# Patient Record
Sex: Male | Born: 1960
Health system: Southern US, Community
[De-identification: ages and names within clinical notes are randomized; demographics above are authoritative.]

## PROBLEM LIST (undated history)

## (undated) DIAGNOSIS — K7460 Unspecified cirrhosis of liver: Secondary | ICD-10-CM

## (undated) DIAGNOSIS — I739 Peripheral vascular disease, unspecified: Secondary | ICD-10-CM

## (undated) DIAGNOSIS — I714 Abdominal aortic aneurysm, without rupture, unspecified: Secondary | ICD-10-CM

## (undated) DIAGNOSIS — K469 Unspecified abdominal hernia without obstruction or gangrene: Secondary | ICD-10-CM

## (undated) DIAGNOSIS — R161 Splenomegaly, not elsewhere classified: Secondary | ICD-10-CM

## (undated) DIAGNOSIS — I219 Acute myocardial infarction, unspecified: Secondary | ICD-10-CM

## (undated) DIAGNOSIS — I2510 Atherosclerotic heart disease of native coronary artery without angina pectoris: Secondary | ICD-10-CM

## (undated) DIAGNOSIS — L0390 Cellulitis, unspecified: Secondary | ICD-10-CM

## (undated) DIAGNOSIS — B182 Chronic viral hepatitis C: Secondary | ICD-10-CM

## (undated) DIAGNOSIS — J449 Chronic obstructive pulmonary disease, unspecified: Secondary | ICD-10-CM

## (undated) HISTORY — PX: OTHER SURGICAL HISTORY: SHX169

## (undated) HISTORY — PX: CHOLECYSTECTOMY: SHX55

## (undated) HISTORY — PX: HERNIA REPAIR: SHX51

## (undated) HISTORY — PX: ABDOMINAL AORTIC ANEURYSM REPAIR W/ ENDOLUMINAL GRAFT: SUR7

---

## 2017-05-06 ENCOUNTER — Encounter (HOSPITAL_COMMUNITY): Payer: Self-pay | Admitting: Emergency Medicine

## 2017-05-06 ENCOUNTER — Emergency Department (HOSPITAL_COMMUNITY): Payer: Medicare Other

## 2017-05-06 ENCOUNTER — Emergency Department (HOSPITAL_COMMUNITY)
Admission: EM | Admit: 2017-05-06 | Discharge: 2017-05-06 | Disposition: A | Discharge status: Home / Self Care | Payer: Medicare Other | Attending: Emergency Medicine | Admitting: Emergency Medicine

## 2017-05-06 DIAGNOSIS — L03113 Cellulitis of right upper limb: Secondary | ICD-10-CM | POA: Diagnosis not present

## 2017-05-06 DIAGNOSIS — M79631 Pain in right forearm: Secondary | ICD-10-CM | POA: Diagnosis present

## 2017-05-06 DIAGNOSIS — F17200 Nicotine dependence, unspecified, uncomplicated: Secondary | ICD-10-CM | POA: Insufficient documentation

## 2017-05-06 HISTORY — DX: Cellulitis, unspecified: L03.90

## 2017-05-06 LAB — CBC
HCT: 39.3 % (ref 39.0–52.0)
HEMOGLOBIN: 13 g/dL (ref 13.0–17.0)
MCH: 27.7 pg (ref 26.0–34.0)
MCHC: 33.1 g/dL (ref 30.0–36.0)
MCV: 83.6 fL (ref 78.0–100.0)
Platelets: 119 10*3/uL — ABNORMAL LOW (ref 150–400)
RBC: 4.7 MIL/uL (ref 4.22–5.81)
RDW: 15.9 % — ABNORMAL HIGH (ref 11.5–15.5)
WBC: 5.7 10*3/uL (ref 4.0–10.5)

## 2017-05-06 MED ORDER — IBUPROFEN 400 MG PO TABS
400.0000 mg | ORAL_TABLET | Freq: Once | ORAL | Status: AC
  Administered 2017-05-06: 400 mg via ORAL
  Filled 2017-05-06: qty 1

## 2017-05-06 MED ORDER — CLINDAMYCIN HCL 150 MG PO CAPS
300.0000 mg | ORAL_CAPSULE | Freq: Once | ORAL | Status: AC
  Administered 2017-05-06: 300 mg via ORAL
  Filled 2017-05-06: qty 2

## 2017-05-06 MED ORDER — CLINDAMYCIN HCL 150 MG PO CAPS: 150.0000 mg | ORAL_CAPSULE | Freq: Four times a day (QID) | ORAL | 0 refills | Status: DC

## 2017-05-06 NOTE — ED Provider Notes (Signed)
[facility]-EMERGENCY DEPT Provider Note   CSN: [id number] Arrival date & time: 05/06/17  0946     History   Chief Complaint Chief Complaint  Patient presents with  . Abscess  . Arm Pain    HPI Edward Little is a 56 y.o. male.  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 Patient states the swelling started 3 days ago but redness Much worse today. He denies any drug use in the last 18 months. He denies fever but states he felt slightly chilled this morning. He has been taking ibuprofen for pain. The last time surgery had to be done to this arm was 1 year ago. He currently moved here from [facility] and has no doctors in this area. He denies any numbness or weakness to the right hand.   The history is provided by the patient.  Abscess  Location:  Shoulder/arm Shoulder/arm abscess location:  R forearm Abscess quality: painful, redness and warmth   Duration:  3 days Progression:  Worsening Pain details:    Quality:  Pressure and throbbing   Severity:  Moderate   Duration:  3 days   Timing:  Constant   Progression:  Worsening Chronicity:  Recurrent Context: injected drug use   Context: not diabetes   Context comment:  Last injected IVD 18 months ago Relieved by:  Nothing Worsened by:  Nothing Ineffective treatments:  NSAIDs Associated symptoms: no anorexia, no fever, no nausea and no vomiting   Arm Pain     Past Medical History:  Diagnosis Date  . Cellulitis     There are no active problems to display for this patient.   History reviewed. No pertinent surgical history.     Home Medications    Prior to Admission medications   Not on File    Family History No family history on file.  Social History Social History  Substance Use Topics  . Smoking  status: Current Every Day Smoker  . Smokeless tobacco: Current User  . Alcohol use No     Allergies   Patient has no allergy information on record.   Review of Systems Review of Systems  Constitutional: Negative for fever.  Gastrointestinal: Negative for anorexia, nausea and vomiting.  All other systems reviewed and are negative.    Physical Exam Updated Vital Signs BP (!) 167/76 (BP Location: Left Arm)   Pulse 99   Ht  (1.854 m)   Wt 90.7 kg (200 lb)   SpO2 99%   BMI 26.39 kg/m   Physical Exam  Constitutional: He is oriented to person, place, and time. He appears well-developed and well-nourished.  HENT:  Head: Normocephalic and atraumatic.  Cardiovascular: Normal rate and intact distal pulses.   Pulmonary/Chest: Effort normal.  Musculoskeletal: He exhibits tenderness.       Arms: Neurological: He is alert and oriented to person, place, and time.  Skin: Skin is warm.  Psychiatric: He has a normal mood and affect. His behavior is normal.  Nursing note and vitals reviewed.    ED Treatments / Results  Labs (all labs ordered are listed, but only abnormal results are displayed) Labs Reviewed  CBC - Abnormal; Notable for the following:       Result Value   RDW 15.9 (*)    Platelets 119 (*)  All other components within normal limits    EKG  EKG Interpretation None       Radiology Dg Forearm Right  Result Date: 05/06/2017 CLINICAL DATA:  Right forearm pain and swelling. EXAM: RIGHT FOREARM - 2 VIEW COMPARISON:  None. FINDINGS: There is no evidence of fracture or other focal bone lesions. Linear metallic density is seen in the soft tissues volar to the distal ulna concerning for foreign body. IMPRESSION: No fracture or dislocation is noted. Linear metallic foreign body is seen in soft tissues volar to distal ulnar shaft. Electronically Signed   By: Lupita Raider, M.D.   On: 05/06/2017 15:35    Procedures Procedures (including critical care  time)  Medications Ordered in ED Medications  clindamycin (CLEOCIN) capsule 300 mg (not administered)  ibuprofen (ADVIL,MOTRIN) tablet 400 mg (not administered)     Initial Impression / Assessment and Plan / ED Course  I have reviewed the triage vital signs and the nursing notes.  Pertinent labs & imaging results that were available during my care of the patient were reviewed by me and considered in my medical decision making (see chart for details).     Patient presenting with evidence of cellulitis to his right arm. Patient has had multiple surgical debridements of that arm in the past but today appears to just be cellulitis. There is no induration, fluctuance or localized point tenderness. He does have erythema, warmth and swelling. The swelling does extend down to the dorsal surface of the top of the hands. No palmar surface of the arm affected. Vascularly intact at this time. Normal handgrip in sensation on the right. X-ray without acute findings. Patient is afebrile here.  Will treat with clindamycin and close follow-up. Patient instructed to return here in 2 days if he is unable to see a provider for recheck.  Final Clinical Impressions(s) / ED Diagnoses   Final diagnoses:  Cellulitis of right upper extremity    New Prescriptions New Prescriptions   CLINDAMYCIN (CLEOCIN) 150 MG CAPSULE    Take 1 capsule (150 mg total) by mouth every 6 (six) hours.     Gwyneth Sprout, MD 05/06/17 [id number]

## 2017-05-06 NOTE — ED Triage Notes (Signed)
Pt. Stated, I've had redness and pain on rt. Forearm since Thursday. This morning it was worse.  Rt. forearm is red and swollen

## 2017-05-06 NOTE — ED Notes (Signed)
Declined W/C at D/C and was escorted to lobby by RN. 

## 2017-05-06 NOTE — ED Triage Notes (Signed)
Boarders of cellulitis on rt arm  Marked with skin marker.

## 2017-06-07 ENCOUNTER — Encounter (HOSPITAL_COMMUNITY): Payer: Self-pay | Admitting: *Deleted

## 2017-06-07 ENCOUNTER — Encounter (HOSPITAL_COMMUNITY): Payer: Self-pay | Admitting: Emergency Medicine

## 2017-06-07 ENCOUNTER — Emergency Department (HOSPITAL_COMMUNITY)
Admission: EM | Admit: 2017-06-07 | Discharge: 2017-06-07 | Disposition: A | Discharge status: Home / Self Care | Payer: Medicare Other | Source: Home / Self Care | Attending: Emergency Medicine | Admitting: Emergency Medicine

## 2017-06-07 ENCOUNTER — Emergency Department (HOSPITAL_COMMUNITY)
Admission: EM | Admit: 2017-06-07 | Discharge: 2017-06-07 | Disposition: A | Discharge status: Home / Self Care | Payer: Medicare Other | Attending: Emergency Medicine | Admitting: Emergency Medicine

## 2017-06-07 ENCOUNTER — Other Ambulatory Visit: Payer: Self-pay

## 2017-06-07 DIAGNOSIS — M79605 Pain in left leg: Principal | ICD-10-CM

## 2017-06-07 DIAGNOSIS — M79604 Pain in right leg: Principal | ICD-10-CM

## 2017-06-07 DIAGNOSIS — M79606 Pain in leg, unspecified: Secondary | ICD-10-CM | POA: Diagnosis present

## 2017-06-07 DIAGNOSIS — Z79899 Other long term (current) drug therapy: Secondary | ICD-10-CM | POA: Insufficient documentation

## 2017-06-07 DIAGNOSIS — G8929 Other chronic pain: Secondary | ICD-10-CM | POA: Insufficient documentation

## 2017-06-07 DIAGNOSIS — I739 Peripheral vascular disease, unspecified: Secondary | ICD-10-CM

## 2017-06-07 DIAGNOSIS — Z7982 Long term (current) use of aspirin: Secondary | ICD-10-CM | POA: Insufficient documentation

## 2017-06-07 DIAGNOSIS — F17200 Nicotine dependence, unspecified, uncomplicated: Secondary | ICD-10-CM | POA: Insufficient documentation

## 2017-06-07 DIAGNOSIS — F17228 Nicotine dependence, chewing tobacco, with other nicotine-induced disorders: Secondary | ICD-10-CM | POA: Insufficient documentation

## 2017-06-07 HISTORY — DX: Abdominal aortic aneurysm, without rupture: I71.4

## 2017-06-07 HISTORY — DX: Abdominal aortic aneurysm, without rupture, unspecified: I71.40

## 2017-06-07 LAB — BASIC METABOLIC PANEL
ANION GAP: 6 (ref 5–15)
BUN: 10 mg/dL (ref 6–20)
CALCIUM: 9.5 mg/dL (ref 8.9–10.3)
CO2: 24 mmol/L (ref 22–32)
Chloride: 105 mmol/L (ref 101–111)
Creatinine, Ser: 0.72 mg/dL (ref 0.61–1.24)
GFR calc Af Amer: >60 mL/min (ref >60)
GFR calc non Af Amer: >60 mL/min (ref >60)
GLUCOSE: 107 mg/dL — AB (ref 65–99)
Potassium: 3.5 mmol/L (ref 3.5–5.1)
Sodium: 135 mmol/L (ref 135–145)

## 2017-06-07 LAB — CBC
HEMATOCRIT: 38.9 % — AB (ref 39.0–52.0)
Hemoglobin: 13.3 g/dL (ref 13.0–17.0)
MCH: 27.8 pg (ref 26.0–34.0)
MCHC: 34.2 g/dL (ref 30.0–36.0)
MCV: 81.4 fL (ref 78.0–100.0)
Platelets: 141 10*3/uL — ABNORMAL LOW (ref 150–400)
RBC: 4.78 MIL/uL (ref 4.22–5.81)
RDW: 14.5 % (ref 11.5–15.5)
WBC: 5.4 10*3/uL (ref 4.0–10.5)

## 2017-06-07 MED ORDER — IBUPROFEN 400 MG PO TABS
600.0000 mg | ORAL_TABLET | Freq: Once | ORAL | Status: AC
  Administered 2017-06-07: 18:00:00 600 mg via ORAL
  Filled 2017-06-07: qty 1

## 2017-06-07 MED ORDER — ETODOLAC 300 MG PO CAPS: 300.0000 mg | ORAL_CAPSULE | Freq: Three times a day (TID) | ORAL | 0 refills | Status: DC

## 2017-06-07 NOTE — Discharge Instructions (Signed)
Take the medication as prescribed.   Follow-up with the vascular surgeon as directed. Follow up with the [facility] to establish a primary care doctor.   Follow-up with the referred dental clinic.   Return to the Emergency Department for any worsening pain, numbness/weakness, discoloration of the legs, chest pain, difficulty breathing or any other worsening or concerning symptoms.

## 2017-06-07 NOTE — ED Provider Notes (Signed)
[facility] EMERGENCY DEPARTMENT Provider Note   CSN: [id number] Arrival date & time: 06/07/17  1559    History   Chief Complaint Chief Complaint  Patient presents with  . Leg Pain    HPI Edward Little is a 56 y.o. male a small history of claudication who presents with chronic bilateral leg pain.  Patient reports this is been ongoing for several months to the pain has persisted.  Patient reports that pain is worse with ambulating.  He reports that he has been taking Advil with minimal improvement in symptoms.  He reports that the last time he took any NSAIDs was yesterday.  Patient was seen at [facility] long emergency department this morning for my patient was evaluated and discharged with instructions to follow-up with vascular surgeon in [place] given NSAIDs for pain relief.  Patient reports that he did not fill the prescription nor call the vascular surgeon.  Patient comes to the emergency department today to see if something more can be done for his symptoms.  Patient also complaining of a small sore to the right nasal alar.  He is not sure how long it has been going on and has not been evaluated for it.  He denies any complaints with it but just wants to have somebody look at it.  Patient also reports that a few days ago, he had some pain in his gums.  Patient denies any fever, chest pain, difficulty breathing, abdominal pain, nausea/vomiting, numbness/weakness of his legs.  The history is provided by the patient.    Past Medical History:  Diagnosis Date  . AAA (abdominal aortic aneurysm) (HCC)   . Cellulitis     There are no active problems to display for this patient.   Past Surgical History:  Procedure Laterality Date  . ABDOMINAL AORTIC ANEURYSM REPAIR W/ ENDOLUMINAL GRAFT    . revision of AAA graft         Home Medications    Prior to Admission medications   Medication Sig Start Date End Date Taking? Authorizing Provider  albuterol (PROVENTIL  HFA;VENTOLIN HFA) 108 (90 Base) MCG/ACT inhaler Inhale 2 puffs into the lungs every 4 (four) hours as needed for wheezing. 09/14/14   [provider]  aspirin 81 MG chewable tablet Chew 81 mg by mouth daily. 04/27/17   [provider]  escitalopram (LEXAPRO) 20 MG tablet Take 20 mg by mouth daily. 04/27/17   [provider]  etodolac (LODINE) 300 MG capsule Take 1 capsule (300 mg total) every 8 (eight) hours by mouth. 06/07/17   Linwood Dibbles, MD  ibuprofen (ADVIL,MOTRIN) 800 MG tablet Take 800 mg by mouth every 8 (eight) hours as needed for pain.    [provider]  QUEtiapine (SEROQUEL) 100 MG tablet Take 100 mg by mouth at bedtime. 04/27/17   [provider]  tamsulosin (FLOMAX) 0.4 MG CAPS capsule Take 0.4 mg by mouth daily. 10/25/15   [provider]    Family History History reviewed. No pertinent family history.  Social History Social History   Tobacco Use  . Smoking status: Current Every Day Smoker  . Smokeless tobacco: Current User  Substance Use Topics  . Alcohol use: No  . Drug use: No     Allergies   Ketorolac tromethamine and Tape   Review of Systems Review of Systems  Constitutional: Negative for fever.  Respiratory: Negative for shortness of breath.   Cardiovascular: Negative for chest pain.  Gastrointestinal: Negative for abdominal pain, nausea  and vomiting.  Musculoskeletal:       Chronic leg pain  Neurological: Negative for weakness and numbness.     Physical Exam Updated Vital Signs BP (!) 141/85   Pulse 88   Temp (!) 97.5 F (36.4 C) (Oral)   Resp 15   SpO2 98%   Physical Exam  Constitutional: He appears well-developed and well-nourished.  Sitting comfortably on examination table  HENT:  Head: Normocephalic and atraumatic.  Mouth/Throat: Uvula is midline. No trismus in the jaw. Dental caries present.  Diffuse, scattered dental caries throughout.  No surrounding no facial swelling or redness.  No  neck swelling.  Small 0.5 cm abrasion noted to the right nalar.  Eyes: Conjunctivae and EOM are normal. Right eye exhibits no discharge. Left eye exhibits no discharge. No scleral icterus.  Cardiovascular: Normal rate, regular rhythm and normal pulses.  Pulses:      Radial pulses are 2+ on the right side, and 2+ on the left side.       Dorsalis pedis pulses are 2+ on the right side, and 2+ on the left side.  Pulmonary/Chest: Effort normal and breath sounds normal.  Neurological: He is alert.  Skin: Skin is warm and dry. Capillary refill takes less than 2 seconds.  BLE are not dusky in appearance or cool to touch.  Psychiatric: He has a normal mood and affect. His speech is normal and behavior is normal.  Nursing note and vitals reviewed.    ED Treatments / Results  Labs (all labs ordered are listed, but only abnormal results are displayed) Labs Reviewed - No data to display  EKG  EKG Interpretation None       Radiology No results found.  Procedures Procedures (including critical care time)  Medications Ordered in ED Medications  ibuprofen (ADVIL,MOTRIN) tablet 600 mg (600 mg Oral Given 06/07/17 1751)     Initial Impression / Assessment and Plan / ED Course  I have reviewed the triage vital signs and the nursing notes.  Pertinent labs & imaging results that were available during my care of the patient were reviewed by me and considered in my medical decision making (see chart for details).     56 y.o. male with past medical history of peripheral vascular disease, AAA who presents with bilateral chronic lower extremity pain.  Was seen for same symptoms that was seen on ED earlier this morning.  Came to the emergency department today because he felt like nothing was done for him earlier.  He was evaluated at that time and given a prescription for NSAIDs which he states that he is not filled.  Patient also reports that he was given a referral to vascular surgeon which she  has not followed up with.  Patient denies any new or changes in symptoms but states that he simply wanted a second opinion because he did not feel like he had anything done workup wise this morning.  And also wants evaluation of a small sore that has been on the right lateral aspect of his nose for several months.  Patient also complaining of some generalized he was having a few days ago.  Patient denies any fever, chest pain, difficulty breathing, numbness/weakness, abdominal pain. Patient is afebrile, non-toxic appearing, sitting comfortably on examination table. Vital signs reviewed and stable.  Patient is slightly hypertensive.  Likely secondary to pain and the fact the patient has not been able to take any medications.  Will reassess.  Consider chronic peripheral vascular disease.  History/physical exam not concerning for DVT, or acute arterial embolism. History/physical exam on not concerning for aortic dissection.  Discussed with patient regarding findings on physical exam and that there was no indication for further workup or imaging.  Explained that this is likely a result of his chronic conditions and will need proper follow-up.  Repeat vitals reviewed.  Blood pressure is slightly improved.  Do not suspect aortic dissection at this time.  Discussed with patient regarding his multiple complaints.  Encouraged patient to establish primary care evaluation for continued management of both his pain and his multiple symptoms.  Encouraged patient to follow-up with referred to vascular surgeon for further evaluation.  Patient is a bleeding in the department without any difficulty.  Vital signs stable.  Patient had ample opportunity for questions and discussion. All patient's questions were answered with full understanding. Strict return precautions discussed. Patient expresses understanding and agreement to plan.    Final Clinical Impressions(s) / ED Diagnoses   Final diagnoses:  Chronic pain of both lower  extremities  Claudication [facility](HCC)  Peripheral vascular disease [facility](HCC)    ED Discharge Orders    None       Maxwell CaulLayden, Jnai Snellgrove A, PA-C 06/08/17 0006    Loren RacerYelverton, David, MD 06/12/17 1501

## 2017-06-07 NOTE — ED Triage Notes (Signed)
Pt brought in by [facility] with c/o bilateral leg pain  Pt states since he had the revision of his AAA graft he has been having pain  Pt states he has spoken to a surgeon but they are not sure what they can do to help  Pt is also c/o left groin pain  Pt states he has been getting shocking type pain in his left groin that dont last long but are very painful

## 2017-06-07 NOTE — ED Provider Notes (Signed)
[facility]-EMERGENCY DEPT Provider Note   CSN: [id number] Arrival date & time: 06/07/17  0028     History   Chief Complaint Chief Complaint  Patient presents with  . Groin Pain  . Leg Pain    HPI Edward Little is a 56 y.o. male.  HPI Pt has been having increasing pain in his legs.  The pain increases with walking.  Just walking to from a parking lot to a store causes a lot of trouble. Pt has been here a couple of months now. Yesterday when he woke up in the am he was having a lot of pain.  It seemed to get better during the day.  Last night it got bad again so he came to the ED.   He was told his symptoms were related to PVD.  He has had vascular procedures for this in the past in [place].  He feels like he has had that trouble ever since then.  He has followed up with his surgeons.   Past Medical History:  Diagnosis Date  . AAA (abdominal aortic aneurysm) (HCC)   . Cellulitis     There are no active problems to display for this patient.   Past Surgical History:  Procedure Laterality Date  . ABDOMINAL AORTIC ANEURYSM REPAIR W/ ENDOLUMINAL GRAFT    . revision of AAA graft         Home Medications    Prior to Admission medications   Medication Sig Start Date End Date Taking? Authorizing Provider  albuterol (PROVENTIL HFA;VENTOLIN HFA) 108 (90 Base) MCG/ACT inhaler Inhale 2 puffs into the lungs every 4 (four) hours as needed for wheezing. 09/14/14  Yes [provider]  aspirin 81 MG chewable tablet Chew 81 mg by mouth daily. 04/27/17  Yes [provider]  escitalopram (LEXAPRO) 20 MG tablet Take 20 mg by mouth daily. 04/27/17  Yes [provider]  ibuprofen (ADVIL,MOTRIN) 800 MG tablet Take 800 mg by mouth every 8 (eight) hours as needed for pain.   Yes [provider]  QUEtiapine (SEROQUEL) 100 MG tablet Take 100 mg by mouth at bedtime. 04/27/17  Yes [provider]  tamsulosin (FLOMAX) 0.4 MG CAPS  capsule Take 0.4 mg by mouth daily. 10/25/15  Yes [provider]  etodolac (LODINE) 300 MG capsule Take 1 capsule (300 mg total) every 8 (eight) hours by mouth. 06/07/17   Linwood DibblesKnapp, Laverle Pillard, MD    Family History History reviewed. No pertinent family history.  Social History Social History   Tobacco Use  . Smoking status: Current Every Day Smoker  . Smokeless tobacco: Current User  Substance Use Topics  . Alcohol use: No  . Drug use: No     Allergies   Ketorolac tromethamine and Tape   Review of Systems Review of Systems  All other systems reviewed and are negative.    Physical Exam Updated Vital Signs BP 122/64 (BP Location: Left Arm)   Pulse 75   Temp 98.2 F (36.8 C)   Resp 16   SpO2 97%   Physical Exam  Constitutional: He appears well-developed and well-nourished. No distress.  HENT:  Head: Normocephalic and atraumatic.  Right Ear: External ear normal.  Left Ear: External ear normal.  Eyes: Conjunctivae are normal. Right eye exhibits no discharge. Left eye exhibits no discharge. No scleral icterus.  Neck: Neck supple. No tracheal deviation present.  Cardiovascular: Normal rate, regular rhythm and intact distal pulses.  Pulses:  Dorsalis pedis pulses are 2+ on the right side, and 2+ on the left side.  Pulmonary/Chest: Effort normal and breath sounds normal. No stridor. No respiratory distress. He has no wheezes. He has no rales.  Abdominal: Soft. Bowel sounds are normal. He exhibits no distension. There is no tenderness. There is no rebound and no guarding. Hernia confirmed negative in the right inguinal area and confirmed negative in the left inguinal area.  Genitourinary: Right testis shows no swelling. Left testis shows no swelling.  Musculoskeletal: He exhibits no edema or tenderness.  Feet are warm and well perfused, no cyanosis  Lymphadenopathy: No inguinal adenopathy noted on the right or left side.  Neurological: He is alert. He has normal  strength. No cranial nerve deficit (no facial droop, extraocular movements intact, no slurred speech) or sensory deficit. He exhibits normal muscle tone. He displays no seizure activity. Coordination normal.  Skin: Skin is warm and dry. No rash noted. He is not diaphoretic.  ? Track marks in hands, (pt denies)  Psychiatric: He has a normal mood and affect.  Nursing note and vitals reviewed.    ED Treatments / Results  Labs (all labs ordered are listed, but only abnormal results are displayed) Labs Reviewed  CBC - Abnormal; Notable for the following components:      Result Value   HCT 38.9 (*)    Platelets 141 (*)    All other components within normal limits  BASIC METABOLIC PANEL - Abnormal; Notable for the following components:   Glucose, Bld 107 (*)    All other components within normal limits      Procedures Procedures (including critical care time)  Medications Ordered in ED Medications - No data to display   Initial Impression / Assessment and Plan / ED Course  I have reviewed the triage vital signs and the nursing notes.  Pertinent labs & imaging results that were available during my care of the patient were reviewed by me and considered in my medical decision making (see chart for details).  Clinical Course as of Jun 07 909  Thu Jun 07, 2017  [id number] Reviewed [place] database.  Last opiate rx march 2018.  Multiple rx through 2017.  [JK]    Clinical Course User Index [JK] Linwood DibblesKnapp, Latoiya Maradiaga, MD    Patient presented to the emergency room with complaints of chronic leg pain and fatigue.  Patient states his symptoms increased with activity.  He also mentions that shocking type pain in his groin.  On exam he has no signs of swelling or abnormality in the inguinal region.  He does have palpable pulses in bilateral dorsalis pedis.  No signs of any acute vascular occlusion.  Patient does certainly have a history of prior vascular disease and claudication.  I think he is stable to  follow-up with a primary care doctor and I did give them the name of the vascular surgeon in this area now that he is living in [place].  Final Clinical Impressions(s) / ED Diagnoses   Final diagnoses:  Claudication [facility](HCC)    ED Discharge Orders        Ordered    etodolac (LODINE) 300 MG capsule  Every 8 hours    Comments:  As needed for pain   06/07/17 0907       Linwood DibblesKnapp, Yaman Grauberger, MD 06/07/17 [phone]

## 2017-06-07 NOTE — ED Notes (Signed)
ED Provider at bedside. 

## 2017-06-07 NOTE — ED Triage Notes (Signed)
Pt in c/o bilateral leg pain that is chronic but pain has been getting worse over the last few weeks, pain is worse in the right leg, he also has a sore on his face that he would like evaluated. No distress noted

## 2017-06-07 NOTE — ED Notes (Signed)
Pt verbalized understanding of discharge instructions and denies any further questions at this time.   

## 2017-06-07 NOTE — Discharge Instructions (Signed)
Continue to try to quit smoking as we discussed.  Take the medications as needed for pain.  Follow-up with your primary care doctor and consider seeing a vascular surgeon

## 2017-06-08 ENCOUNTER — Encounter (HOSPITAL_COMMUNITY): Payer: Self-pay | Admitting: *Deleted

## 2017-06-08 ENCOUNTER — Emergency Department (HOSPITAL_COMMUNITY)
Admission: EM | Admit: 2017-06-08 | Discharge: 2017-06-08 | Disposition: A | Discharge status: Home / Self Care | Payer: Medicare Other | Attending: Emergency Medicine | Admitting: Emergency Medicine

## 2017-06-08 ENCOUNTER — Other Ambulatory Visit: Payer: Self-pay

## 2017-06-08 DIAGNOSIS — F1110 Opioid abuse, uncomplicated: Secondary | ICD-10-CM | POA: Diagnosis not present

## 2017-06-08 DIAGNOSIS — F17210 Nicotine dependence, cigarettes, uncomplicated: Secondary | ICD-10-CM | POA: Insufficient documentation

## 2017-06-08 DIAGNOSIS — Z79899 Other long term (current) drug therapy: Secondary | ICD-10-CM | POA: Diagnosis not present

## 2017-06-08 DIAGNOSIS — Z7982 Long term (current) use of aspirin: Secondary | ICD-10-CM | POA: Insufficient documentation

## 2017-06-08 LAB — CBC
HCT: 43.4 % (ref 39.0–52.0)
Hemoglobin: 14.7 g/dL (ref 13.0–17.0)
MCH: 28.2 pg (ref 26.0–34.0)
MCHC: 33.9 g/dL (ref 30.0–36.0)
MCV: 83.1 fL (ref 78.0–100.0)
PLATELETS: 132 10*3/uL — AB (ref 150–400)
RBC: 5.22 MIL/uL (ref 4.22–5.81)
RDW: 14.6 % (ref 11.5–15.5)
WBC: 5 10*3/uL (ref 4.0–10.5)

## 2017-06-08 LAB — COMPREHENSIVE METABOLIC PANEL
ALK PHOS: 94 U/L (ref 38–126)
ALT: 100 U/L — AB (ref 17–63)
AST: 82 U/L — ABNORMAL HIGH (ref 15–41)
Albumin: 3.6 g/dL (ref 3.5–5.0)
Anion gap: 7 (ref 5–15)
BILIRUBIN TOTAL: 1 mg/dL (ref 0.3–1.2)
BUN: 7 mg/dL (ref 6–20)
CALCIUM: 9.5 mg/dL (ref 8.9–10.3)
CO2: 24 mmol/L (ref 22–32)
CREATININE: 0.82 mg/dL (ref 0.61–1.24)
Chloride: 103 mmol/L (ref 101–111)
GFR calc non Af Amer: >60 mL/min (ref >60)
Glucose, Bld: 94 mg/dL (ref 65–99)
Potassium: 4.1 mmol/L (ref 3.5–5.1)
SODIUM: 134 mmol/L — AB (ref 135–145)
Total Protein: 8 g/dL (ref 6.5–8.1)

## 2017-06-08 LAB — ETHANOL: Alcohol, Ethyl (B): <10 mg/dL (ref <10)

## 2017-06-08 MED ORDER — CLONIDINE HCL 0.1 MG PO TABS: 0.1000 mg | ORAL_TABLET | Freq: Two times a day (BID) | ORAL | 0 refills | Status: DC

## 2017-06-08 MED ORDER — CYCLOBENZAPRINE HCL 10 MG PO TABS: 10.0000 mg | ORAL_TABLET | Freq: Two times a day (BID) | ORAL | 0 refills | Status: DC | PRN

## 2017-06-08 MED ORDER — ONDANSETRON HCL 4 MG PO TABS: 4.0000 mg | ORAL_TABLET | Freq: Four times a day (QID) | ORAL | 0 refills | Status: DC

## 2017-06-08 NOTE — ED Notes (Signed)
Pt refusing to speak with nurse stating "I already spoke with the doctor and I am not staying because I cant detox here and that is what I need"

## 2017-06-08 NOTE — Discharge Instructions (Signed)
Call the above resources for help with opiate detox and rehab.  Also you can get over the counter imodium for your diarrhea

## 2017-06-08 NOTE — ED Provider Notes (Signed)
[facility] Provider Note   CSN: [id number] Arrival date & time: 06/08/17  [id number]     History   Chief Complaint Chief Complaint  Patient presents with  . Addiction Problem    HPI Edward Little is a 56 y.o. male.  Patient is a 56 year old male with a significant past mental history of AAA status post repair, [name];VENTOLIN HFA) 108 (90 Base) MCG/ACT inhaler Inhale 2 puffs into the lungs every 4 (four) hours as needed for wheezing. 09/14/14   [provider]  aspirin 81 MG chewable tablet Chew 81 mg by mouth daily. 04/27/17   [provider]  cloNIDine (CATAPRES) 0.1 MG tablet Take 1 tablet (0.1 mg total) 2 (two) times daily by mouth. For withdrawal 06/08/17   Gwyneth SproutPlunkett, [name], MD  cyclobenzaprine (FLEXERIL) 10 MG tablet Take 1 tablet (10 mg total) 2 (two) times daily as needed by mouth for muscle spasms. 06/08/17   Gwyneth SproutPlunkett, [name], MD  escitalopram (LEXAPRO) 20 MG tablet Take 20 mg by mouth daily. 04/27/17   [provider]  etodolac (LODINE) 300 MG capsule Take 1 capsule (300 mg total) every 8 (eight) hours by mouth. 06/07/17   Linwood DibblesKnapp, [name], MD  ibuprofen  (ADVIL,MOTRIN) 800 MG tablet Take 800 mg by mouth every 8 (eight) hours as needed for pain.    [provider]  ondansetron (ZOFRAN) 4 MG tablet Take 1 tablet (4 mg total) every 6 (six) hours by mouth. 06/08/17   Gwyneth SproutPlunkett, [name], MD  QUEtiapine (SEROQUEL) 100 MG tablet Take 100 mg by mouth at bedtime. 04/27/17   [provider]  tamsulosin (FLOMAX) 0.4 MG CAPS capsule Take 0.4 mg by mouth daily. 10/25/15   [provider]    Family History No family history on file.  Social History Social History   Tobacco Use  . Smoking status: Current Every Day Smoker    Packs/day: 1.00    Types: Cigarettes  . Smokeless tobacco: Current User  Substance Use Topics  . Alcohol use: No  . Drug use: Yes    Comment: heroine     Allergies   Ketorolac tromethamine and Tape   Review of Systems Review of Systems  All other systems reviewed and are negative.    Physical Exam Updated Vital Signs BP 130/82 (BP Location: Right Arm)   Pulse 82   Temp 97.8 F (36.6 C) (Oral)   Resp 16   Ht 6\' 1"  (1.854 m)   Wt 83.9 kg (185 lb)   SpO2 100%   BMI 24.41 kg/m   Physical Exam  Constitutional: He is oriented to person, place, [name], place, [name], but only abnormal results are displayed) Labs Reviewed  COMPREHENSIVE METABOLIC PANEL - Abnormal; [name]; [name],  HOSP PERFORMED    EKG  EKG Interpretation None       Radiology No results found.  Procedures Procedures (including critical care time)  Medications Ordered in ED Medications - No data to display   Initial Impression / Assessment and Plan / ED Course  I have reviewed the triage vital signs and the nursing notes.  Pertinent labs & imaging results that were available during my care of the patient were reviewed by me and considered in my medical decision making (see chart for details).     Pt seen 2 times yesterday for chronic leg pain however coming today stating he really needs opiate detox.  He looked on the Internet which told him to come here.  Patient is denying any acute symptoms and states he last used heroin and opiates yesterday.  Discussed with patient that we do not provide inpatient detox.  He became upset but was willing to accept prescriptions for opiate withdrawal.  Also patient was given all the resources where he himself could set up detox and inpatient rehab.  Final Clinical Impressions(s) / ED Diagnoses   Final diagnoses:  Opioid abuse [facility](HCC)    ED Discharge Orders        Ordered    cloNIDine (CATAPRES) 0.1 MG tablet  2 times daily     06/08/17 0932    cyclobenzaprine (FLEXERIL) 10 MG tablet  2 times daily PRN     06/08/17 0932    ondansetron (ZOFRAN) 4 MG tablet  Every 6 hours     06/08/17 0932       Gwyneth SproutPlunkett, [name], MD 06/08/17 1046

## 2017-06-08 NOTE — ED Notes (Signed)
Pt refusing to speak with nurse stating " I already spoke with the doctor and iI'm not staying

## 2017-06-08 NOTE — ED Triage Notes (Signed)
Pt in requesting detox from opioids and heroine last use yesterday, pt seen here yesterday for leg pain & "circulation problems" denies SI & HI, A&O x4

## 2017-06-09 ENCOUNTER — Encounter (HOSPITAL_COMMUNITY): Payer: Self-pay

## 2017-06-09 ENCOUNTER — Other Ambulatory Visit: Payer: Self-pay

## 2017-06-09 ENCOUNTER — Emergency Department (HOSPITAL_COMMUNITY)
Admission: EM | Admit: 2017-06-09 | Discharge: 2017-06-09 | Disposition: A | Discharge status: Home / Self Care | Payer: Medicare Other | Attending: Emergency Medicine | Admitting: Emergency Medicine

## 2017-06-09 DIAGNOSIS — G8929 Other chronic pain: Secondary | ICD-10-CM | POA: Diagnosis not present

## 2017-06-09 DIAGNOSIS — M79604 Pain in right leg: Secondary | ICD-10-CM | POA: Insufficient documentation

## 2017-06-09 DIAGNOSIS — F17210 Nicotine dependence, cigarettes, uncomplicated: Secondary | ICD-10-CM | POA: Diagnosis not present

## 2017-06-09 DIAGNOSIS — Z79899 Other long term (current) drug therapy: Secondary | ICD-10-CM | POA: Diagnosis not present

## 2017-06-09 DIAGNOSIS — M79605 Pain in left leg: Secondary | ICD-10-CM

## 2017-06-09 NOTE — ED Triage Notes (Signed)
Pt BIB [facility] c/o bilateral leg pain. He states that he thinks he could have blood clots. Pt has a hx of HTN and is not compliant with his blood pressure medication. Increased pain on standing. A&Ox4. [facility] from same.

## 2017-06-09 NOTE — ED Provider Notes (Signed)
[facility]-EMERGENCY DEPT Provider Note   CSN: [id number] Arrival date & time: 06/09/17  0106     History   Chief Complaint Chief Complaint  Patient presents with  . Leg Pain  . Claudication    HPI Edward Little is a 56 y.o. male.  Patient presents to the ED with a chief complaint of bilateral lower extremity pain.  He states that the pain is worsened with walking.  This is a chronic problem.  He denies any new injuries or changes in his symptoms.  He has been seen multiple times for the same. Today, he is requesting a referral to vascular.   The history is provided by the patient. No language interpreter was used.    Past Medical History:  Diagnosis Date  . AAA (abdominal aortic aneurysm) (HCC)   . Cellulitis     There are no active problems to display for this patient.   Past Surgical History:  Procedure Laterality Date  . ABDOMINAL AORTIC ANEURYSM REPAIR W/ ENDOLUMINAL GRAFT    . revision of AAA graft         Home Medications    Prior to Admission medications   Medication Sig Start Date End Date Taking? Authorizing Provider  albuterol (PROVENTIL HFA;VENTOLIN HFA) 108 (90 Base) MCG/ACT inhaler Inhale 2 puffs into the lungs every 4 (four) hours as needed for wheezing. 09/14/14   [provider]  aspirin 81 MG chewable tablet Chew 81 mg by mouth daily. 04/27/17   [provider]  cloNIDine (CATAPRES) 0.1 MG tablet Take 1 tablet (0.1 mg total) 2 (two) times daily by mouth. For withdrawal 06/08/17   Gwyneth SproutPlunkett, Whitney, MD  cyclobenzaprine (FLEXERIL) 10 MG tablet Take 1 tablet (10 mg total) 2 (two) times daily as needed by mouth for muscle spasms. 06/08/17   Gwyneth SproutPlunkett, Whitney, MD  escitalopram (LEXAPRO) 20 MG tablet Take 20 mg by mouth daily. 04/27/17   [provider]  etodolac (LODINE) 300 MG capsule Take 1 capsule (300 mg total) every 8 (eight) hours by mouth. 06/07/17   Linwood DibblesKnapp, [name], MD  ibuprofen (ADVIL,MOTRIN) 800 MG  tablet Take 800 mg by mouth every 8 (eight) hours as needed for pain.    [provider]  ondansetron (ZOFRAN) 4 MG tablet Take 1 tablet (4 mg total) every 6 (six) hours by mouth. 06/08/17   Gwyneth SproutPlunkett, Whitney, MD  QUEtiapine (SEROQUEL) 100 MG tablet Take 100 mg by mouth at bedtime. 04/27/17   [provider]  tamsulosin (FLOMAX) 0.4 MG CAPS capsule Take 0.4 mg by mouth daily. 10/25/15   [provider]    Family History History reviewed. No pertinent family history.  Social History Social History   Tobacco Use  . Smoking status: Current Every Day Smoker    Packs/day: 1.00    Types: Cigarettes  . Smokeless tobacco: Current User  Substance Use Topics  . Alcohol use: No  . Drug use: Yes    Comment: heroine     Allergies   Ketorolac tromethamine and Tape   Review of Systems Review of Systems  All other systems reviewed and are negative.    Physical Exam Updated Vital Signs BP (!) 142/80 (BP Location: Left Arm)   Pulse 74   Temp 97.8 F (36.6 C) (Oral)   Resp 18   SpO2 98%   Physical Exam  Constitutional: He is oriented to person, place, and time. No distress.  HENT:  Head: Normocephalic and atraumatic.  Eyes: Conjunctivae and EOM are  normal. Pupils are equal, round, and reactive to light.  Neck: No tracheal deviation present.  Cardiovascular: Normal rate and intact distal pulses.  Intact distal pulses, brisk cap refill  Pulmonary/Chest: Effort normal. No respiratory distress.  Abdominal: Soft.  Musculoskeletal: Normal range of motion.  Neurological: He is alert and oriented to person, place, and time.  Sensation and strength intact  Skin: Skin is warm and dry. He is not diaphoretic.  Psychiatric: Judgment normal.  Nursing note and vitals reviewed.    ED Treatments / Results  Labs (all labs ordered are listed, but only abnormal results are displayed) Labs Reviewed - No data to display  EKG  EKG Interpretation None        Radiology No results found.  Procedures Procedures (including critical care time)  Medications Ordered in ED Medications - No data to display   Initial Impression / Assessment and Plan / ED Course  I have reviewed the triage vital signs and the nursing notes.  Pertinent labs & imaging results that were available during my care of the patient were reviewed by me and considered in my medical decision making (see chart for details).     Patient with chronic lower extremity pain.  States that he is new to the area.  He needs resources to be seen by someone that can help.  He did not ask for pain medicine.  He is stable and non-toxic appearing.  He has not calf tenderness, no swelling, no sign of DVT, infection, or ischemia.  Will discharge with vascular surgery and PCP follow-up.  Final Clinical Impressions(s) / ED Diagnoses   Final diagnoses:  Chronic pain of both lower extremities    ED Discharge Orders    None       Roxy HorsemanBrowning, Mikahla Wisor, PA-C 06/09/17 0604    Melene PlanFloyd, Dan, DO 06/09/17 [phone]

## 2017-06-25 ENCOUNTER — Emergency Department (HOSPITAL_COMMUNITY)
Admission: EM | Admit: 2017-06-25 | Discharge: 2017-06-26 | Discharge status: Against Medical Advice | Payer: Medicare Other | Attending: Emergency Medicine | Admitting: Emergency Medicine

## 2017-06-25 ENCOUNTER — Other Ambulatory Visit: Payer: Self-pay

## 2017-06-25 ENCOUNTER — Encounter (HOSPITAL_COMMUNITY): Payer: Self-pay | Admitting: Emergency Medicine

## 2017-06-25 DIAGNOSIS — Z5321 Procedure and treatment not carried out due to patient leaving prior to being seen by health care provider: Secondary | ICD-10-CM | POA: Insufficient documentation

## 2017-06-25 DIAGNOSIS — R32 Unspecified urinary incontinence: Secondary | ICD-10-CM | POA: Insufficient documentation

## 2017-06-25 NOTE — ED Notes (Signed)
No response for vitals re-check at 18:01.

## 2017-06-25 NOTE — ED Notes (Signed)
Writer called twice for room assignment, no response 

## 2017-06-25 NOTE — ED Triage Notes (Signed)
Patient complains of intermittent urinary incontinence for the last few days. States he suffers from chronic back pain but denies falling or any recent injury. States he feels the urge to urinate and finds that if he is unable to hold his urine for longer than one or two minutes once he feels the urge. Denies any other complaints at this time.

## 2017-06-26 ENCOUNTER — Ambulatory Visit (INDEPENDENT_AMBULATORY_CARE_PROVIDER_SITE_OTHER): Payer: Medicare Other | Admitting: Family Medicine

## 2017-06-26 ENCOUNTER — Encounter: Payer: Self-pay | Admitting: Pediatric Intensive Care

## 2017-06-26 ENCOUNTER — Telehealth: Payer: Self-pay | Admitting: General Practice

## 2017-06-26 ENCOUNTER — Encounter: Payer: Self-pay | Admitting: Family Medicine

## 2017-06-26 ENCOUNTER — Other Ambulatory Visit: Payer: Self-pay

## 2017-06-26 VITALS — BP 168/72 | HR 80 | Temp 97.9°F | Resp 16 | Ht 73.0 in | Wt 191.0 lb

## 2017-06-26 DIAGNOSIS — N400 Enlarged prostate without lower urinary tract symptoms: Secondary | ICD-10-CM

## 2017-06-26 DIAGNOSIS — N3001 Acute cystitis with hematuria: Secondary | ICD-10-CM

## 2017-06-26 DIAGNOSIS — R8290 Unspecified abnormal findings in urine: Secondary | ICD-10-CM

## 2017-06-26 DIAGNOSIS — I10 Essential (primary) hypertension: Secondary | ICD-10-CM | POA: Diagnosis not present

## 2017-06-26 DIAGNOSIS — Z760 Encounter for issue of repeat prescription: Secondary | ICD-10-CM

## 2017-06-26 LAB — POCT URINALYSIS DIP (MANUAL ENTRY)
Bilirubin, UA: NEGATIVE
Glucose, UA: NEGATIVE mg/dL
Ketones, POC UA: NEGATIVE mg/dL
Nitrite, UA: NEGATIVE
Protein Ur, POC: 30 mg/dL — AB
Spec Grav, UA: 1.02 (ref 1.010–1.025)
Urobilinogen, UA: 1 E.U./dL
pH, UA: 6.5 (ref 5.0–8.0)

## 2017-06-26 MED ORDER — QUETIAPINE FUMARATE 100 MG PO TABS: 100.0000 mg | ORAL_TABLET | Freq: Every day | ORAL | 0 refills | Status: DC

## 2017-06-26 MED ORDER — ALBUTEROL SULFATE HFA 108 (90 BASE) MCG/ACT IN AERS: 2.0000 | INHALATION_SPRAY | RESPIRATORY_TRACT | 3 refills | Status: DC | PRN

## 2017-06-26 MED ORDER — METOPROLOL TARTRATE 25 MG PO TABS: 25.0000 mg | ORAL_TABLET | Freq: Two times a day (BID) | ORAL | 3 refills | Status: DC

## 2017-06-26 MED ORDER — ESCITALOPRAM OXALATE 20 MG PO TABS: 20.0000 mg | ORAL_TABLET | Freq: Every day | ORAL | 0 refills | Status: DC

## 2017-06-26 MED ORDER — CEFTRIAXONE SODIUM 1 G IJ SOLR
1.0000 g | Freq: Once | INTRAMUSCULAR | Status: AC
  Administered 2017-06-26: 1 g via INTRAMUSCULAR

## 2017-06-26 MED ORDER — CIPROFLOXACIN HCL 500 MG PO TABS: 500.0000 mg | ORAL_TABLET | Freq: Two times a day (BID) | ORAL | 0 refills | Status: DC

## 2017-06-26 MED FILL — QUETIAPINE FUMARATE 100 MG: 100 | 30 days supply | Qty: 30 | Fill #0

## 2017-06-26 MED FILL — PROAIR HFA 90 MCG INHALER: 108 (90 BAS | 16 days supply | Qty: 9 | Fill #0

## 2017-06-26 MED FILL — METOPROLOL TARTRATE 25 MG T: 25 | 30 days supply | Qty: 60 | Fill #0

## 2017-06-26 MED FILL — CIPROFLOXACIN HCL 500 MG TA: 500 | 10 days supply | Qty: 20 | Fill #0

## 2017-06-26 MED FILL — ESCITALOPRAM 20 MG TABLET: 20 | 30 days supply | Qty: 30 | Fill #0

## 2017-06-26 NOTE — Telephone Encounter (Signed)
Gave information to Dr. Leretha PolSantiago

## 2017-06-26 NOTE — Patient Instructions (Signed)
     IF you received an x-ray today, you will receive an invoice from [facility]. Please contact [facility] at [phone] with questions or concerns regarding your invoice.   IF you received labwork today, you will receive an invoice from [facility]. Please contact [facility] at [phone] with questions or concerns regarding your invoice.   Our billing staff will not be able to assist you with questions regarding bills from these companies.  You will be contacted with the lab results as soon as they are available. The fastest way to get your results is to activate your My Chart account. Instructions are located on the last page of this paperwork. If you have not heard from us regarding the results in 2 weeks, please contact this office.     

## 2017-06-26 NOTE — Progress Notes (Signed)
12/4/20184:31 PM  Edward JoLarry Stuckert 20-Apr-1961, 56 y.o. male [id number]  Chief Complaint  Patient presents with  . Back Pain    left side x 5 days    HPI:   Patient is a [age] y.o. male who presents today for lower back pain, urinary urgency with incontinence, frequency, blood tinged urine for 3-4 days. Denies nausea or vomiting, rectal pain, fever or chills. Endorses malaise. Has no h/o kidney stones. Does have BPH, normally well controlled on tamsulosin. He has had a UTI in the past in setting of pseudoaneurysm compressing ureters.   He is also requesting refills of medications. He has been out of his metoprolol for several days. He has recently moved here and does not have a PCP. [facility].  Depression screen PHQ 2/9 06/26/2017  Decreased Interest 0  Down, Depressed, Hopeless 0  PHQ - 2 Score 0    Allergies  Allergen Reactions  . Iodinated Diagnostic Agents Hives  . Ketorolac Tromethamine Diarrhea    Stomach cramps  . Tape Itching    Prior to Admission medications   Medication Sig Start Date End Date Taking? Authorizing Provider  albuterol (PROVENTIL HFA;VENTOLIN HFA) 108 (90 Base) MCG/ACT inhaler Inhale 2 puffs into the lungs every 4 (four) hours as needed for wheezing. 09/14/14  Yes [provider]  aspirin 81 MG chewable tablet Chew 81 mg by mouth daily. 04/27/17  Yes [provider]  cloNIDine (CATAPRES) 0.1 MG tablet Take 1 tablet (0.1 mg total) 2 (two) times daily by mouth. For withdrawal 06/08/17  Yes Gwyneth SproutPlunkett, Whitney, MD  cyclobenzaprine (FLEXERIL) 10 MG tablet Take 1 tablet (10 mg total) 2 (two) times daily as needed by mouth for muscle spasms. 06/08/17  Yes Plunkett, Alphonzo LemmingsWhitney, MD  escitalopram (LEXAPRO) 20 MG tablet Take 20 mg by mouth daily. 04/27/17  Yes [provider]  etodolac (LODINE) 300 MG capsule Take 1 capsule (300 mg total) every 8 (eight) hours by mouth. 06/07/17  Yes Linwood DibblesKnapp, [name], MD    ibuprofen (ADVIL,MOTRIN) 800 MG tablet Take 800 mg by mouth every 8 (eight) hours as needed for pain.   Yes [provider]  ondansetron (ZOFRAN) 4 MG tablet Take 1 tablet (4 mg total) every 6 (six) hours by mouth. 06/08/17  Yes Plunkett, Whitney, MD  QUEtiapine (SEROQUEL) 100 MG tablet Take 100 mg by mouth at bedtime. 04/27/17  Yes [provider]  tamsulosin (FLOMAX) 0.4 MG CAPS capsule Take 0.4 mg by mouth daily. 10/25/15  Yes [provider]    Past Medical History:  Diagnosis Date  . AAA (abdominal aortic aneurysm) (HCC)   . Cellulitis     Past Surgical History:  Procedure Laterality Date  . ABDOMINAL AORTIC ANEURYSM REPAIR W/ ENDOLUMINAL GRAFT    . revision of AAA graft      Social History   Tobacco Use  . Smoking status: Current Every Day Smoker    Packs/day: 1.00    Types: Cigarettes  . Smokeless tobacco: Current User  Substance Use Topics  . Alcohol use: No    History reviewed. No pertinent family history.  ROS Per hpi  OBJECTIVE:  Blood pressure (!) 168/72, pulse 80, temperature 97.9 F (36.6 C), resp. rate 16, height 6\' 1"  (1.854 m), weight 191 lb (86.6 kg), SpO2 100 %.  Physical Exam  Constitutional: He is oriented to person, place, and time and well-developed, well-nourished, and in no distress.  HENT:  Head: Normocephalic and atraumatic.  Mouth/Throat: Oropharynx is clear and moist.  Eyes: EOM are normal. Pupils are equal, round, and reactive to light.  Neck: Neck supple.  Cardiovascular: Normal rate and regular rhythm. Exam reveals no gallop and no friction rub.  No murmur heard. Pulmonary/Chest: Effort normal and breath sounds normal. He has no wheezes. He has no rales.  Abdominal: There is no CVA tenderness.  Neurological: He is alert and oriented to person, place, and time. Gait normal.  Skin: Skin is warm and dry.     Results for orders placed or performed in visit on 06/26/17 (from the past 24 hour(s))  POCT  urinalysis dipstick     Status: Abnormal   Collection Time: 06/26/17  4:00 PM  Result Value Ref Range   Color, UA yellow yellow   Clarity, UA cloudy (A) clear   Glucose, UA negative negative mg/dL   Bilirubin, UA negative negative   Ketones, POC UA negative negative mg/dL   Spec Grav, UA 1.6101.020 9.6041.010 - 1.025   Blood, UA moderate (A) negative   pH, UA 6.5 5.0 - 8.0   Protein Ur, POC =30 (A) negative mg/dL   Urobilinogen, UA 1.0 0.2 or 1.0 E.U./dL   Nitrite, UA Negative Negative   Leukocytes, UA Small (1+) (A) Negative      ASSESSMENT and PLAN  1. Acute cystitis with hematuria - CBC with Differential - cefTRIAXone (ROCEPHIN) injection 1 g - ciprofloxacin (CIPRO) 500 MG tablet; Take 1 tablet (500 mg total) by mouth 2 (two) times daily.  2. Urine findings abnormal - POCT urinalysis dipstick - Urine Culture  3. Essential hypertension, benign - Comprehensive metabolic panel  4. Benign prostatic hyperplasia, unspecified whether lower urinary tract symptoms present - PSA  5. Medication refill - metoprolol tartrate (LOPRESSOR) 25 MG tablet; Take 1 tablet (25 mg total) by mouth 2 (two) times daily. - albuterol (PROVENTIL HFA;VENTOLIN HFA) 108 (90 Base) MCG/ACT inhaler; Inhale 2 puffs into the lungs every 4 (four) hours as needed for wheezing. - escitalopram (LEXAPRO) 20 MG tablet; Take 1 tablet (20 mg total) by mouth daily. - QUEtiapine (SEROQUEL) 100 MG tablet; Take 1 tablet (100 mg total) by mouth at bedtime.  Return in about 3 days (around 06/29/2017).    Myles LippsIrma M Santiago, MD [facility] at [facility] [address] [place], [place] [address] Ph.  [phone] Fax [phone]

## 2017-06-26 NOTE — Congregational Nurse Program (Signed)
Congregational Nurse Program Note  Date of Encounter: 06/26/2017  Past Medical History: Past Medical History:  Diagnosis Date  . AAA (abdominal aortic aneurysm) (HCC)   . Cellulitis     Encounter Details: CNP Questionnaire - 06/26/17 0900      Questionnaire   Patient Status  Not Applicable    Race  White or Caucasian    Location Patient Served At  [facility]    Insurance  Medicare    Uninsured  Not Applicable    Food  Within past 12 months, worried food would run out with no money to buy more    Housing/Utilities  No permanent housing    Transportation  Provided transportation assistance (bus pass, taxi voucher, etc.);Yes, need transportation assistance    Interpersonal Safety  Yes, feel physically and emotionally safe where you currently live    Medication  Yes, have medication insecurities    Medical Provider  No    Referrals  Urgent Care;Primary Care Provider/Clinic    ED Visit Averted  Yes    Life-Saving Intervention Made  Not Applicable      New client- states history of "a lot of medical problems". Most bothersome today is urinary frequency, burning, "a little blood in pee" and not being able to completely empty his bladder. States that he was incontinent of urine this morning. States history of AAA with repair. States history of hypertension but has had no funds for med co-pays. Endorses past history of heroin use but has not used "since the middle of last month". CN referred client to [facility] for appointment today. Cab vouchers arranged. CN left message for clinic staff to have any meds prescribed to [facility]. Client agrees to plan and will go to appointment.

## 2017-06-26 NOTE — Telephone Encounter (Signed)
Copied from CRM [phone]#[id number]. Topic: Quick Communication - See Telephone Encounter >> Jun 26, 2017 10:09 AM Rudi CocoLathan, Miliana Gangwer M, NT wrote: CRM for notification. See Telephone encounter for:   06/26/17. Shann [facility]ia Hussey from [facility] called to make an appt. For pt. Who is completely out of meds. And needs to be seen today. Was able to schedule pt. With dr. Pecolia AdesSantigao today at 4pm. Pt. Does have medicare but unble to pay co pay or pay for any meds. Please e-scribe rx. To the main  outpatient Pharmacy and [facility] will [facility] meds. For pt. Any questions please call [facility] at [phone]

## 2017-06-27 LAB — CBC WITH DIFFERENTIAL/PLATELET
Basophils Absolute: 0 10*3/uL (ref 0.0–0.2)
Basos: 0 %
EOS (ABSOLUTE): 0.1 10*3/uL (ref 0.0–0.4)
Eos: 2 %
Hematocrit: 40.4 % (ref 37.5–51.0)
Hemoglobin: 13.6 g/dL (ref 13.0–17.7)
Immature Grans (Abs): 0 10*3/uL (ref 0.0–0.1)
Immature Granulocytes: 0 %
Lymphocytes Absolute: 0.7 10*3/uL (ref 0.7–3.1)
Lymphs: 17 %
MCH: 27.8 pg (ref 26.6–33.0)
MCHC: 33.7 g/dL (ref 31.5–35.7)
MCV: 83 fL (ref 79–97)
Monocytes Absolute: 0.3 10*3/uL (ref 0.1–0.9)
Monocytes: 8 %
Neutrophils Absolute: 3.1 10*3/uL (ref 1.4–7.0)
Neutrophils: 73 %
Platelets: 138 10*3/uL — ABNORMAL LOW (ref 150–379)
RBC: 4.89 x10E6/uL (ref 4.14–5.80)
RDW: 14.6 % (ref 12.3–15.4)
WBC: 4.3 10*3/uL (ref 3.4–10.8)

## 2017-06-27 LAB — COMPREHENSIVE METABOLIC PANEL
ALT: 107 IU/L — ABNORMAL HIGH (ref 0–44)
AST: 124 IU/L — ABNORMAL HIGH (ref 0–40)
Albumin/Globulin Ratio: 1 — ABNORMAL LOW (ref 1.2–2.2)
Albumin: 3.8 g/dL (ref 3.5–5.5)
Alkaline Phosphatase: 101 IU/L (ref 39–117)
BUN/Creatinine Ratio: 21 — ABNORMAL HIGH (ref 9–20)
BUN: 12 mg/dL (ref 6–24)
Bilirubin Total: 0.4 mg/dL (ref 0.0–1.2)
CO2: 22 mmol/L (ref 20–29)
Calcium: 9.3 mg/dL (ref 8.7–10.2)
Chloride: 103 mmol/L (ref 96–106)
Creatinine, Ser: 0.56 mg/dL — ABNORMAL LOW (ref 0.76–1.27)
GFR calc Af Amer: 135 mL/min/{1.73_m2} (ref >59)
GFR calc non Af Amer: 116 mL/min/{1.73_m2} (ref >59)
Globulin, Total: 3.7 g/dL (ref 1.5–4.5)
Glucose: 102 mg/dL — ABNORMAL HIGH (ref 65–99)
Potassium: 4.3 mmol/L (ref 3.5–5.2)
Sodium: 138 mmol/L (ref 134–144)
Total Protein: 7.5 g/dL (ref 6.0–8.5)

## 2017-06-27 LAB — PSA: Prostate Specific Ag, Serum: 0.6 ng/mL (ref 0.0–4.0)

## 2017-06-28 LAB — URINE CULTURE

## 2017-06-29 ENCOUNTER — Ambulatory Visit: Payer: Medicare Other | Admitting: Family Medicine

## 2017-06-29 ENCOUNTER — Ambulatory Visit: Payer: Medicare Other

## 2017-07-02 ENCOUNTER — Other Ambulatory Visit: Payer: Self-pay | Admitting: Family Medicine

## 2017-07-02 MED ORDER — SULFAMETHOXAZOLE-TRIMETHOPRIM 800-160 MG PO TABS: 1.0000 | ORAL_TABLET | Freq: Two times a day (BID) | ORAL | 0 refills | Status: DC

## 2017-07-02 MED FILL — SULFAMETHOXAZOLE/TMP DS TAB: 800-160 | 7 days supply | Qty: 20 | Fill #0

## 2017-07-04 ENCOUNTER — Telehealth: Payer: Self-pay

## 2017-07-04 NOTE — Telephone Encounter (Signed)
Left message on voicemail to return call to schedule AWV.   

## 2017-07-05 ENCOUNTER — Ambulatory Visit: Payer: Self-pay | Admitting: *Deleted

## 2017-07-05 ENCOUNTER — Telehealth: Payer: Self-pay | Admitting: Family Medicine

## 2017-07-05 NOTE — Telephone Encounter (Signed)
Pt called in to be advised. Pt says that he would like to speak with a nurse to be triaged. Pt says that he was taking Seroquel for a infection and has completed medication, pt says that provider then sent in Bactrim for him. Pt says that he hasn't started that medication because it is to expensive. I advised pt that I could sent provider a message to see if they could call in something else that is less expensive, pt says that he would like to speak with a nurse to be triaged first.    Pt would like a call back   CB: [phone]

## 2017-07-05 NOTE — Telephone Encounter (Signed)
Pt states he has been having dizziness since yesterday to the point he feels like he is going to pass out. Pt states he has generalized weakness as well. Advised pt that he needed to be seen and treated at the ED, with some one else driving. Pt verbalized understanding and plans to be treated in the ED. Reason for Disposition . SEVERE dizziness (e.g., unable to stand, requires support to walk, feels like passing out now)  Answer Assessment - Initial Assessment Questions 1. DESCRIPTION: "Describe your dizziness."     Feels weak and lightheaded 2. LIGHTHEADED: "Do you feel lightheaded?" (e.g., somewhat faint, woozy, weak upon standing)     Yes, worse with standing 3. VERTIGO: "Do you feel like either you or the room is spinning or tilting?" (i.e. vertigo)     Yes 4. SEVERITY: "How bad is it?"  "Do you feel like you are going to faint?" "Can you stand and walk?"   - MILD - walking normally   - MODERATE - interferes with normal activities (e.g., work, school)    - SEVERE - unable to stand, requires support to walk, feels like passing out now.      moderate 5. ONSET:  "When did the dizziness begin?"     Yesterday 6. AGGRAVATING FACTORS: "Does anything make it worse?" (e.g., standing, change in head position)     Standing 7. HEART RATE: "Can you tell me your heart rate?" "How many beats in 15 seconds?"  (Note: not all patients can do this)       24 beats per 15secs 8. CAUSE: "What do you think is causing the dizziness?"     Does not know 9. RECURRENT SYMPTOM: "Have you had dizziness before?" If so, ask: "When was the last time?" "What happened that time?"     Yes, quite some time ago 10. OTHER SYMPTOMS: "Do you have any other symptoms?" (e.g., fever, chest pain, vomiting, diarrhea, bleeding) Diarrhea-yesterday, nausea-yesterday  Protocols used: DIZZINESS Tereasa [employer]- LIGHTHEADEDNESS-A-AH

## 2017-07-05 NOTE — Telephone Encounter (Signed)
See triage note.

## 2017-07-06 ENCOUNTER — Telehealth: Payer: Self-pay

## 2017-07-06 NOTE — Telephone Encounter (Signed)
  Left pt. A message to call back to discuss symptoms.    Copied from CRM [phone]. Topic: Quick Communication - See Telephone Encounter >> Jul 05, 2017  5:01 PM Herby AbrahamJohnson, Shiquita C wrote: CRM for notification. See Telephone encounter for: 07/05/17.  Pt called in to be advised. Pt says that he would like to speak with a nurse to be triaged. Pt says that he was taking Seroquel for a infection and has completed medication, pt says that provider then sent in Bactrim for him. Pt says that he hasn't started that medication because it is to expensive. I advised pt that I could sent provider a message to see if they could call in something else that is less expensive, pt says that he would like to speak with a nurse to be triaged first.    Pt would like a call back   CB: [phone]

## 2017-07-31 ENCOUNTER — Encounter: Payer: Self-pay | Admitting: Pediatric Intensive Care

## 2017-08-09 MED FILL — METOPROLOL TARTRATE 25 MG T: 25 | 30 days supply | Qty: 60 | Fill #1 | Status: TO

## 2017-08-09 MED FILL — QUETIAPINE FUMARATE 100 MG: 100 | 30 days supply | Qty: 30 | Fill #1 | Status: TO

## 2017-08-16 ENCOUNTER — Emergency Department (HOSPITAL_COMMUNITY): Payer: Medicare Other

## 2017-08-16 ENCOUNTER — Emergency Department (HOSPITAL_COMMUNITY)
Admission: EM | Admit: 2017-08-16 | Discharge: 2017-08-16 | Disposition: A | Discharge status: Other Acute Inpatient Hospital | Payer: Medicare Other | Attending: Emergency Medicine | Admitting: Emergency Medicine

## 2017-08-16 ENCOUNTER — Other Ambulatory Visit: Payer: Self-pay

## 2017-08-16 ENCOUNTER — Encounter (HOSPITAL_COMMUNITY): Payer: Self-pay

## 2017-08-16 ENCOUNTER — Inpatient Hospital Stay (HOSPITAL_COMMUNITY)
Admission: AD | Admit: 2017-08-16 | Discharge: 2017-08-21 | DRG: 885 | Disposition: A | Discharge status: Home / Self Care | Payer: Medicare Other | Source: Intra-hospital | Attending: Psychiatry | Admitting: Psychiatry

## 2017-08-16 DIAGNOSIS — Z9114 Patient's other noncompliance with medication regimen: Secondary | ICD-10-CM | POA: Insufficient documentation

## 2017-08-16 DIAGNOSIS — F17210 Nicotine dependence, cigarettes, uncomplicated: Secondary | ICD-10-CM | POA: Diagnosis present

## 2017-08-16 DIAGNOSIS — I10 Essential (primary) hypertension: Secondary | ICD-10-CM | POA: Diagnosis present

## 2017-08-16 DIAGNOSIS — N400 Enlarged prostate without lower urinary tract symptoms: Secondary | ICD-10-CM | POA: Diagnosis present

## 2017-08-16 DIAGNOSIS — Z7982 Long term (current) use of aspirin: Secondary | ICD-10-CM | POA: Diagnosis not present

## 2017-08-16 DIAGNOSIS — R45851 Suicidal ideations: Secondary | ICD-10-CM | POA: Insufficient documentation

## 2017-08-16 DIAGNOSIS — R05 Cough: Secondary | ICD-10-CM | POA: Insufficient documentation

## 2017-08-16 DIAGNOSIS — G4700 Insomnia, unspecified: Secondary | ICD-10-CM | POA: Diagnosis not present

## 2017-08-16 DIAGNOSIS — R440 Auditory hallucinations: Secondary | ICD-10-CM | POA: Insufficient documentation

## 2017-08-16 DIAGNOSIS — F1120 Opioid dependence, uncomplicated: Secondary | ICD-10-CM | POA: Diagnosis not present

## 2017-08-16 DIAGNOSIS — Z79899 Other long term (current) drug therapy: Secondary | ICD-10-CM | POA: Diagnosis not present

## 2017-08-16 DIAGNOSIS — R441 Visual hallucinations: Secondary | ICD-10-CM | POA: Insufficient documentation

## 2017-08-16 DIAGNOSIS — F39 Unspecified mood [affective] disorder: Secondary | ICD-10-CM | POA: Diagnosis not present

## 2017-08-16 DIAGNOSIS — R450 Nervousness: Secondary | ICD-10-CM | POA: Diagnosis not present

## 2017-08-16 DIAGNOSIS — F332 Major depressive disorder, recurrent severe without psychotic features: Secondary | ICD-10-CM | POA: Insufficient documentation

## 2017-08-16 DIAGNOSIS — F419 Anxiety disorder, unspecified: Secondary | ICD-10-CM | POA: Diagnosis not present

## 2017-08-16 DIAGNOSIS — R0602 Shortness of breath: Secondary | ICD-10-CM | POA: Diagnosis not present

## 2017-08-16 DIAGNOSIS — F322 Major depressive disorder, single episode, severe without psychotic features: Secondary | ICD-10-CM | POA: Diagnosis present

## 2017-08-16 DIAGNOSIS — F1123 Opioid dependence with withdrawal: Secondary | ICD-10-CM | POA: Diagnosis present

## 2017-08-16 LAB — CBC
HEMATOCRIT: 46 % (ref 39.0–52.0)
HEMOGLOBIN: 15.7 g/dL (ref 13.0–17.0)
MCH: 27.1 pg (ref 26.0–34.0)
MCHC: 34.1 g/dL (ref 30.0–36.0)
MCV: 79.3 fL (ref 78.0–100.0)
Platelets: 128 10*3/uL — ABNORMAL LOW (ref 150–400)
RBC: 5.8 MIL/uL (ref 4.22–5.81)
RDW: 14.9 % (ref 11.5–15.5)
WBC: 5 10*3/uL (ref 4.0–10.5)

## 2017-08-16 LAB — COMPREHENSIVE METABOLIC PANEL
ALBUMIN: 3.7 g/dL (ref 3.5–5.0)
ALT: 66 U/L — ABNORMAL HIGH (ref 17–63)
ANION GAP: 11 (ref 5–15)
AST: 64 U/L — AB (ref 15–41)
Alkaline Phosphatase: 99 U/L (ref 38–126)
BILIRUBIN TOTAL: 1.3 mg/dL — AB (ref 0.3–1.2)
BUN: 9 mg/dL (ref 6–20)
CO2: 22 mmol/L (ref 22–32)
Calcium: 9.7 mg/dL (ref 8.9–10.3)
Chloride: 102 mmol/L (ref 101–111)
Creatinine, Ser: 0.84 mg/dL (ref 0.61–1.24)
GFR calc Af Amer: >60 mL/min (ref >60)
GFR calc non Af Amer: >60 mL/min (ref >60)
GLUCOSE: 128 mg/dL — AB (ref 65–99)
POTASSIUM: 3.7 mmol/L (ref 3.5–5.1)
SODIUM: 135 mmol/L (ref 135–145)
Total Protein: 8.2 g/dL — ABNORMAL HIGH (ref 6.5–8.1)

## 2017-08-16 LAB — SALICYLATE LEVEL: Salicylate Lvl: <7 mg/dL (ref 2.8–30.0)

## 2017-08-16 LAB — ETHANOL: Alcohol, Ethyl (B): <10 mg/dL (ref <10)

## 2017-08-16 LAB — ACETAMINOPHEN LEVEL

## 2017-08-16 MED ORDER — LOPERAMIDE HCL 2 MG PO CAPS: 2.0000 mg | ORAL_CAPSULE | ORAL | Status: DC | PRN

## 2017-08-16 MED ORDER — ASPIRIN 81 MG PO CHEW
81.0000 mg | CHEWABLE_TABLET | Freq: Every day | ORAL | Status: DC
  Administered 2017-08-16: 81 mg via ORAL
  Filled 2017-08-16: qty 1

## 2017-08-16 MED ORDER — CLONIDINE HCL 0.1 MG PO TABS
0.1000 mg | ORAL_TABLET | ORAL | Status: DC
  Administered 2017-08-19 – 2017-08-20 (×3): 0.1 mg via ORAL
  Filled 2017-08-16 (×4): qty 1

## 2017-08-16 MED ORDER — ONDANSETRON HCL 4 MG PO TABS: 4.0000 mg | ORAL_TABLET | Freq: Three times a day (TID) | ORAL | Status: DC | PRN

## 2017-08-16 MED ORDER — ONDANSETRON 4 MG PO TBDP
4.0000 mg | ORAL_TABLET | Freq: Four times a day (QID) | ORAL | Status: DC | PRN
  Filled 2017-08-16: qty 10

## 2017-08-16 MED ORDER — CLONIDINE HCL 0.1 MG PO TABS
0.1000 mg | ORAL_TABLET | Freq: Four times a day (QID) | ORAL | Status: AC
  Administered 2017-08-17 – 2017-08-19 (×9): 0.1 mg via ORAL
  Filled 2017-08-16 (×12): qty 1

## 2017-08-16 MED ORDER — ESCITALOPRAM OXALATE 20 MG PO TABS
20.0000 mg | ORAL_TABLET | Freq: Every day | ORAL | Status: DC
  Administered 2017-08-17 – 2017-08-20 (×4): 20 mg via ORAL
  Filled 2017-08-16: qty 2
  Filled 2017-08-16 (×2): qty 1
  Filled 2017-08-16: qty 14
  Filled 2017-08-16 (×3): qty 1

## 2017-08-16 MED ORDER — METOPROLOL TARTRATE 25 MG PO TABS
25.0000 mg | ORAL_TABLET | Freq: Two times a day (BID) | ORAL | Status: DC
  Administered 2017-08-17 – 2017-08-20 (×5): 25 mg via ORAL
  Filled 2017-08-16 (×4): qty 1
  Filled 2017-08-16: qty 28
  Filled 2017-08-16 (×5): qty 1
  Filled 2017-08-16: qty 28
  Filled 2017-08-16: qty 1

## 2017-08-16 MED ORDER — TAMSULOSIN HCL 0.4 MG PO CAPS
0.4000 mg | ORAL_CAPSULE | Freq: Every day | ORAL | Status: DC
  Administered 2017-08-17 – 2017-08-20 (×4): 0.4 mg via ORAL
  Filled 2017-08-16: qty 1
  Filled 2017-08-16: qty 14
  Filled 2017-08-16 (×4): qty 1

## 2017-08-16 MED ORDER — NICOTINE 21 MG/24HR TD PT24
21.0000 mg | MEDICATED_PATCH | Freq: Every day | TRANSDERMAL | Status: DC
  Administered 2017-08-16: 21 mg via TRANSDERMAL
  Filled 2017-08-16: qty 1

## 2017-08-16 MED ORDER — CYCLOBENZAPRINE HCL 10 MG PO TABS: 10.0000 mg | ORAL_TABLET | Freq: Two times a day (BID) | ORAL | Status: DC | PRN

## 2017-08-16 MED ORDER — ASPIRIN 81 MG PO CHEW
81.0000 mg | CHEWABLE_TABLET | Freq: Every day | ORAL | Status: DC
  Administered 2017-08-17 – 2017-08-20 (×4): 81 mg via ORAL
  Filled 2017-08-16 (×3): qty 1
  Filled 2017-08-16: qty 14
  Filled 2017-08-16 (×3): qty 1

## 2017-08-16 MED ORDER — ALBUTEROL SULFATE HFA 108 (90 BASE) MCG/ACT IN AERS: 2.0000 | INHALATION_SPRAY | RESPIRATORY_TRACT | Status: DC | PRN

## 2017-08-16 MED ORDER — CLONIDINE HCL 0.1 MG PO TABS
0.1000 mg | ORAL_TABLET | Freq: Every day | ORAL | Status: DC
  Filled 2017-08-16: qty 1

## 2017-08-16 MED ORDER — TAMSULOSIN HCL 0.4 MG PO CAPS
0.4000 mg | ORAL_CAPSULE | Freq: Every day | ORAL | Status: DC
  Administered 2017-08-16: 0.4 mg via ORAL
  Filled 2017-08-16: qty 1

## 2017-08-16 MED ORDER — QUETIAPINE FUMARATE 100 MG PO TABS: 100.0000 mg | ORAL_TABLET | Freq: Every day | ORAL | Status: DC

## 2017-08-16 MED ORDER — NICOTINE 21 MG/24HR TD PT24
21.0000 mg | MEDICATED_PATCH | Freq: Every day | TRANSDERMAL | Status: DC
  Administered 2017-08-17 – 2017-08-20 (×4): 21 mg via TRANSDERMAL
  Filled 2017-08-16 (×7): qty 1

## 2017-08-16 MED ORDER — ESCITALOPRAM OXALATE 10 MG PO TABS
20.0000 mg | ORAL_TABLET | Freq: Every day | ORAL | Status: DC
  Administered 2017-08-16: 20 mg via ORAL
  Filled 2017-08-16: qty 2

## 2017-08-16 MED ORDER — QUETIAPINE FUMARATE 100 MG PO TABS
100.0000 mg | ORAL_TABLET | Freq: Every day | ORAL | Status: DC
  Administered 2017-08-17 – 2017-08-20 (×5): 100 mg via ORAL
  Filled 2017-08-16 (×5): qty 1
  Filled 2017-08-16: qty 14
  Filled 2017-08-16: qty 1

## 2017-08-16 MED ORDER — DICYCLOMINE HCL 20 MG PO TABS
20.0000 mg | ORAL_TABLET | Freq: Four times a day (QID) | ORAL | Status: DC | PRN
  Administered 2017-08-17 – 2017-08-19 (×2): 20 mg via ORAL
  Filled 2017-08-16 (×2): qty 1

## 2017-08-16 MED ORDER — ACETAMINOPHEN 325 MG PO TABS
650.0000 mg | ORAL_TABLET | ORAL | Status: DC | PRN
Start: 2017-08-16 — End: 2017-08-16

## 2017-08-16 MED ORDER — MAGNESIUM HYDROXIDE 400 MG/5ML PO SUSP: 30.0000 mL | Freq: Every day | ORAL | Status: DC | PRN

## 2017-08-16 MED ORDER — HYDROXYZINE HCL 25 MG PO TABS: 25.0000 mg | ORAL_TABLET | Freq: Four times a day (QID) | ORAL | Status: DC | PRN

## 2017-08-16 MED ORDER — CYCLOBENZAPRINE HCL 10 MG PO TABS
10.0000 mg | ORAL_TABLET | Freq: Two times a day (BID) | ORAL | Status: DC | PRN
  Administered 2017-08-17 – 2017-08-19 (×4): 10 mg via ORAL
  Filled 2017-08-16: qty 1
  Filled 2017-08-16: qty 10
  Filled 2017-08-16 (×4): qty 1

## 2017-08-16 MED ORDER — QUETIAPINE FUMARATE 100 MG PO TABS
100.0000 mg | ORAL_TABLET | Freq: Every day | ORAL | Status: DC
  Filled 2017-08-16: qty 1

## 2017-08-16 MED ORDER — DICYCLOMINE HCL 20 MG PO TABS: 20.0000 mg | ORAL_TABLET | Freq: Four times a day (QID) | ORAL | Status: DC | PRN

## 2017-08-16 MED ORDER — ALBUTEROL SULFATE HFA 108 (90 BASE) MCG/ACT IN AERS
2.0000 | INHALATION_SPRAY | RESPIRATORY_TRACT | Status: DC | PRN
  Filled 2017-08-16: qty 6.7

## 2017-08-16 MED ORDER — METOPROLOL TARTRATE 25 MG PO TABS
25.0000 mg | ORAL_TABLET | Freq: Two times a day (BID) | ORAL | Status: DC
  Administered 2017-08-16: 25 mg via ORAL
  Filled 2017-08-16: qty 1

## 2017-08-16 MED ORDER — ALUM & MAG HYDROXIDE-SIMETH 200-200-20 MG/5ML PO SUSP: 30.0000 mL | ORAL | Status: DC | PRN

## 2017-08-16 MED ORDER — ETODOLAC 300 MG PO CAPS
300.0000 mg | ORAL_CAPSULE | Freq: Three times a day (TID) | ORAL | Status: DC
  Administered 2017-08-16: 300 mg via ORAL

## 2017-08-16 NOTE — BH Assessment (Signed)
Assessment Note  Edward Little is an 57 y.o. soon to be divorced male. He presented to [facility] ED by himself. Pt reports worsening symptoms of Depression over past couple of weeks. Pt states he can't tolerate it anymore and has thoughts "that I shouldn't have". Pt admits he has SI "a lot" of the time recently. He states there are many ways, but does not describe a specific plan. Pt unable to contract for safety. Pt rents a room in a rooming house, isolates himself and does not have family in the area. Pt denies HI. He describes AH of hearing his name being called. He also reports VH of seeing shadows out of the corners of his eyes. Pt reports a long hx of using heroin. He is vague about amt used but states a consistent "off and on" pattern of use. Pt reports multiple SA tx, with most recent being over 2 years ago. Pt reports he has a court date for a possession charge on 08/29/17.  Disposition: Inpatient Treatment recommend by Assunta Found, NP.  Diagnosis: F33.2 MDD recurrent severe without psychotic features; F11.2 Opioid Use Disorder  Past Medical History:  Past Medical History:  Diagnosis Date  . AAA (abdominal aortic aneurysm) (HCC)   . Cellulitis     Past Surgical History:  Procedure Laterality Date  . ABDOMINAL AORTIC ANEURYSM REPAIR W/ ENDOLUMINAL GRAFT    . revision of AAA graft      Family History: No family history on file.  Social History:  reports that he has been smoking cigarettes.  He has been smoking about 1.00 pack per day. He uses smokeless tobacco. He reports that he uses drugs. He reports that he does not drink alcohol.  Additional Social History:  Alcohol / Drug Use Pain Medications: see MAR Prescriptions: see MAR Over the Counter: see MAR Longest period of sobriety (when/how long): Pt unable to give a precise amt of time Negative Consequences of Use: Financial, Armed forces operational officer, Work / Programmer, multimedia, Personal relationships  CIWA: CIWA-Ar BP: (!) 153/82 Pulse Rate: 74 Nausea and  Vomiting: no nausea and no vomiting Tactile Disturbances: none Tremor: no tremor Auditory Disturbances: not present Paroxysmal Sweats: no sweat visible Visual Disturbances: not present Anxiety: no anxiety, at ease Headache, Fullness in Head: none present Agitation: normal activity Orientation and Clouding of Sensorium: oriented and can do serial additions CIWA-Ar Total: 0 COWS:    Allergies:  Allergies  Allergen Reactions  . Iodinated Diagnostic Agents Hives  . Ketorolac Tromethamine Diarrhea    Stomach cramps  . Tape Itching    Home Medications:  (Not in a hospital admission)  OB/GYN Status:  No LMP for male patient.  General Assessment Data Location of Assessment: [facility] Assessment Services TTS Assessment: In system Is this a Tele or Face-to-Face Assessment?: Tele Assessment Is this an Initial Assessment or a Re-assessment for this encounter?: Initial Assessment Marital status: Divorced Holly name: (NA) Is patient pregnant?: (NA) Pregnancy Status: (NA) Living Arrangements: Other (Comment)(rents a room) Can pt return to current living arrangement?: Yes Admission Status: Voluntary Is patient capable of signing voluntary admission?: Yes Referral Source: Self/Family/Friend Insurance type: Medicare  Medical Screening Exam [facility] Walk-in ONLY) Medical Exam completed: Yes  Crisis Care Plan Living Arrangements: Other (Comment)(rents a room) Legal Guardian: (NA) Name of Psychiatrist: (Sees GP for Seroquel and Lexapro) Name of Therapist: None  Education Status Is patient currently in school?: No Current Grade: (NA) Highest grade of school patient has completed: NA Name of school: [employer] Contact person: NA  Risk to self with the past 6 months Suicidal Ideation: Yes-Currently Present Has patient been a risk to self within the past 6 months prior to admission? : Yes Suicidal Intent: (Pt unable to contract for safety) Has patient had any suicidal intent within the past 6  months prior to admission? : No Is patient at risk for suicide?: Yes Suicidal Plan?: (Pt states he thinks of many different ways to harm self) Has patient had any suicidal plan within the past 6 months prior to admission? : No Access to Means: No What has been your use of drugs/alcohol within the last 12 months?: (Heroin use continually off and on) Previous Attempts/Gestures: No How many times?: (unknown) Other Self Harm Risks: (Overdose) Triggers for Past Attempts: (NA) Intentional Self Injurious Behavior: None Family Suicide History: Unknown Recent stressful life event(s): Divorce, Legal Issues, Financial Problems, Recent negative physical changes Persecutory voices/beliefs?: No Depression: Yes Depression Symptoms: Insomnia, Isolating, Fatigue, Feeling worthless/self pity Substance abuse history and/or treatment for substance abuse?: Yes(Several SA tx- most recent [facility] in [place] 2 yrs ago)  Risk to Others within the past 6 months Homicidal Ideation: No Does patient have any lifetime risk of violence toward others beyond the six months prior to admission? : No Thoughts of Harm to Others: No Current Homicidal Intent: No Current Homicidal Plan: No Access to Homicidal Means: (NA) Identified Victim: (NA) History of harm to others?: No Assessment of Violence: None Noted Violent Behavior Description: (None noted) Does patient have access to weapons?: No Criminal Charges Pending?: Yes Describe Pending Criminal Charges: possession Does patient have a court date: Yes Court Date: 08/29/17 Is patient on probation?: Yes  Psychosis Hallucinations: Auditory, Visual Delusions: None noted  Mental Status Report Appearance/Hygiene: Unremarkable Eye Contact: Fair Motor Activity: Unremarkable Speech: Soft, Unremarkable Level of Consciousness: Alert Mood: Depressed, Sad, Worthless, low self-esteem, Anhedonia Affect: Blunted, Depressed Anxiety Level: Minimal Thought Processes:  Coherent, Relevant Judgement: Unimpaired Orientation: Person, Place, Time, Situation Obsessive Compulsive Thoughts/Behaviors: None  Cognitive Functioning Concentration: Fair Memory: Recent Intact, Remote Intact IQ: Average Insight: Fair Impulse Control: Fair Appetite: Fair Weight Loss: 0 Weight Gain: 0 Sleep: Decreased Total Hours of Sleep: (did not provide number of hours- "not great") Vegetative Symptoms: None  ADLScreening [facility] Assessment Services) Patient's cognitive ability adequate to safely complete daily activities?: Yes Patient able to express need for assistance with ADLs?: Yes Independently performs ADLs?: Yes (appropriate for developmental age)  Prior Inpatient Therapy Prior Inpatient Therapy: Yes Prior Therapy Dates: 2016 Prior Therapy Facilty/Provider(s): [facility] in [place] Reason for Treatment: Substance Abuse- Heroin  Prior Outpatient Therapy Prior Outpatient Therapy: Yes Prior Therapy Dates: ("long ago" per Pt) Prior Therapy Facilty/Provider(s): (Heroin use) Reason for Treatment: (Substance Abuse) Does patient have an ACCT team?: No Does patient have Intensive In-House Services?  : No Does patient have [facility] services? : No Does patient have P4CC services?: No  ADL Screening (condition at time of admission) Patient's cognitive ability adequate to safely complete daily activities?: Yes Is the patient deaf or have difficulty hearing?: No Does the patient have difficulty seeing, even when wearing glasses/contacts?: No Does the patient have difficulty concentrating, remembering, or making decisions?: Yes Patient able to express need for assistance with ADLs?: Yes Does the patient have difficulty dressing or bathing?: No Independently performs ADLs?: Yes (appropriate for developmental age) Does the patient have difficulty walking or climbing stairs?: No Weakness of Legs: None Weakness of Arms/Hands: None  Home Assistive Devices/Equipment Home  Assistive Devices/Equipment: None    Abuse/Neglect Assessment (  Assessment to be complete while patient is alone) Abuse/Neglect Assessment Can Be Completed: (None reported) Values / Beliefs Cultural Requests During Hospitalization: None Spiritual Requests During Hospitalization: None Consults Spiritual Care Consult Needed: No Social Work Consult Needed: No Merchant navy officerAdvance Directives (For Healthcare) Does Patient Have a Medical Advance Directive?: No    Additional Information 1:1 In Past 12 Months?: No CIRT Risk: No Elopement Risk: No Does patient have medical clearance?: Yes     Disposition:  Disposition Initial Assessment Completed for this Encounter: Yes Disposition of Patient: Inpatient treatment program  On Site Evaluation by:   Reviewed with Physician:    Clearnce Sorreleirdre H Yazaira Speas 08/16/2017 6:15 PM

## 2017-08-16 NOTE — ED Notes (Signed)
ORDERED DIET FOR PT. PER RN  

## 2017-08-16 NOTE — ED Notes (Signed)
Belongings stored in locker #1 and valuables with security

## 2017-08-16 NOTE — ED Provider Notes (Signed)
[facility] Provider Note   CSN: [id number] Arrival date & time: 08/16/17  1229     History   Chief Complaint Chief Complaint  Patient presents with  . Suicidal     HPI   Blood pressure (!) 198/110, pulse 83, temperature 97.8 F (36.6 C), temperature source Oral, resp. rate 20, height 6\' 1"  (1.854 m), weight 83.9 kg (185 lb), SpO2 100 %.  Edward Little is a 57 y.o. male past medical history significant for AAA, tobacco use, PAD, complaining of suicidal ideation depression.  He takes Lexapro which was prescribed at a urgent care center he is new to the area.  He has not been taking the Lexapro regularly he does not feel it is effective in general.  He does not have a specific plan of suicide and he has no prior suicide attempts.  He endorses noncommand auditory hallucinations with some shadows seen out of the corner of his eyes which are new for him.  He denies any homicidal ideation.  Denies any alcohol use but states that he does consider himself to be addicted to opiates.  He last injected heroin 1.5 days ago.  He does not have any withdrawal symptoms at this time.  He has a history of prior MI, he states that is a productive cough that is most similar to prior pneumonia.  He states that he intermittently feels shortness of breath but with no chest pain.  He also denies abdominal pain.  Past Medical History:  Diagnosis Date  . AAA (abdominal aortic aneurysm) (HCC)   . Cellulitis     There are no active problems to display for this patient.   Past Surgical History:  Procedure Laterality Date  . ABDOMINAL AORTIC ANEURYSM REPAIR W/ ENDOLUMINAL GRAFT    . revision of AAA graft         Home Medications    Prior to Admission medications   Medication Sig Start Date End Date Taking? Authorizing Provider  albuterol (PROVENTIL HFA;VENTOLIN HFA) 108 (90 Base) MCG/ACT inhaler Inhale 2 puffs into the lungs every 4 (four) hours as needed for  wheezing. 06/26/17   Myles LippsSantiago, Irma M, MD  aspirin 81 MG chewable tablet Chew 81 mg by mouth daily. 04/27/17   [provider]  cyclobenzaprine (FLEXERIL) 10 MG tablet Take 1 tablet (10 mg total) 2 (two) times daily as needed by mouth for muscle spasms. 06/08/17   Gwyneth SproutPlunkett, Whitney, MD  escitalopram (LEXAPRO) 20 MG tablet Take 1 tablet (20 mg total) by mouth daily. 06/26/17   Myles LippsSantiago, Irma M, MD  etodolac (LODINE) 300 MG capsule Take 1 capsule (300 mg total) every 8 (eight) hours by mouth. 06/07/17   Linwood DibblesKnapp, [name], MD  ibuprofen (ADVIL,MOTRIN) 800 MG tablet Take 800 mg by mouth every 8 (eight) hours as needed for pain.    [provider]  metoprolol tartrate (LOPRESSOR) 25 MG tablet Take 1 tablet (25 mg total) by mouth 2 (two) times daily. 06/26/17   Myles LippsSantiago, Irma M, MD  ondansetron (ZOFRAN) 4 MG tablet Take 1 tablet (4 mg total) every 6 (six) hours by mouth. 06/08/17   Gwyneth SproutPlunkett, Whitney, MD  QUEtiapine (SEROQUEL) 100 MG tablet Take 1 tablet (100 mg total) by mouth at bedtime. 06/26/17   Myles LippsSantiago, Irma M, MD  sulfamethoxazole-trimethoprim (BACTRIM DS,SEPTRA DS) 800-160 MG tablet Take 1 tablet by mouth 2 (two) times daily. 07/02/17   Myles LippsSantiago, Irma M, MD  tamsulosin (FLOMAX) 0.4 MG CAPS capsule Take 0.4 mg by  mouth daily. 10/25/15   [provider]    Family History No family history on file.  Social History Social History   Tobacco Use  . Smoking status: Current Every Day Smoker    Packs/day: 1.00    Types: Cigarettes  . Smokeless tobacco: Current User  Substance Use Topics  . Alcohol use: No  . Drug use: Yes    Comment: heroine     Allergies   Iodinated diagnostic agents; Ketorolac tromethamine; and Tape   Review of Systems Review of Systems  A complete review of systems was obtained and all systems are negative except as noted in the HPI and PMH.   Physical Exam Updated Vital Signs BP (!) 198/110 (BP Location: Right Arm)   Pulse 83   Temp 97.8 F (36.6  C) (Oral)   Resp 20   Ht 6\' 1"  (1.854 m)   Wt 83.9 kg (185 lb)   SpO2 100%   BMI 24.41 kg/m   Physical Exam  Constitutional: He is oriented to person, place, and time. He appears well-developed and well-nourished. No distress.  HENT:  Head: Normocephalic and atraumatic.  Mouth/Throat: Oropharynx is clear and moist.  Eyes: Conjunctivae and EOM are normal. Pupils are equal, round, and reactive to light.  Neck: Normal range of motion.  Cardiovascular: Normal rate, regular rhythm and intact distal pulses.  Pulmonary/Chest: Effort normal and breath sounds normal. No stridor. No respiratory distress. He has no wheezes. He has no rales. He exhibits no tenderness.  Abdominal: Soft. He exhibits no distension and no mass. There is no tenderness. There is no rebound and no guarding. No hernia.  Musculoskeletal: Normal range of motion.  Neurological: He is alert and oriented to person, place, and time.  Skin: He is not diaphoretic.  Psychiatric: He has a normal mood and affect. His speech is normal and behavior is normal. He expresses suicidal ideation. He expresses no homicidal ideation. He expresses no homicidal plans.  Calm and cooperative, does not appear to be responding to internal stimuli.  Nursing note and vitals reviewed.    ED Treatments / Results  Labs (all labs ordered are listed, but only abnormal results are displayed) Labs Reviewed  COMPREHENSIVE METABOLIC PANEL - Abnormal; Notable for the following components:      Result Value   Glucose, Bld 128 (*)    Total Protein 8.2 (*)    AST 64 (*)    ALT 66 (*)    Total Bilirubin 1.3 (*)    All other components within normal limits  ACETAMINOPHEN LEVEL - Abnormal; Notable for the following components:   Acetaminophen (Tylenol), Serum <10 (*)    All other components within normal limits  CBC - Abnormal; Notable for the following components:   Platelets 128 (*)    All other components within normal limits  ETHANOL  SALICYLATE  LEVEL  RAPID URINE DRUG SCREEN, HOSP PERFORMED    EKG  EKG Interpretation None       Radiology Dg Chest 2 View  Result Date: 08/16/2017 CLINICAL DATA:  Hemoptysis and shortness of breath for 3-4 days. EXAM: CHEST  2 VIEW COMPARISON:  None. FINDINGS: The chest is hyperexpanded with attenuation of the pulmonary vasculature. Lungs are clear. Heart size is normal. No pneumothorax or pleural effusion. No acute bony abnormality. IMPRESSION: Emphysema without acute disease. Electronically Signed   By: Drusilla Kanner M.D.   On: 08/16/2017 15:03    Procedures Procedures (including critical care time)  Medications Ordered in ED  Medications  albuterol (PROVENTIL HFA;VENTOLIN HFA) 108 (90 Base) MCG/ACT inhaler 2 puff (not administered)  aspirin chewable tablet 81 mg (not administered)  cyclobenzaprine (FLEXERIL) tablet 10 mg (not administered)  escitalopram (LEXAPRO) tablet 20 mg (not administered)  etodolac (LODINE) capsule 300 mg (not administered)  metoprolol tartrate (LOPRESSOR) tablet 25 mg (not administered)  QUEtiapine (SEROQUEL) tablet 100 mg (not administered)  tamsulosin (FLOMAX) capsule 0.4 mg (not administered)  ondansetron (ZOFRAN) tablet 4 mg (not administered)  nicotine (NICODERM CQ - dosed in mg/24 hours) patch 21 mg (not administered)  acetaminophen (TYLENOL) tablet 650 mg (not administered)  dicyclomine (BENTYL) tablet 20 mg (not administered)  hydrOXYzine (ATARAX/VISTARIL) tablet 25 mg (not administered)  loperamide (IMODIUM) capsule 2-4 mg (not administered)     Initial Impression / Assessment and Plan / ED Course  I have reviewed the triage vital signs and the nursing notes.  Pertinent labs & imaging results that were available during my care of the patient were reviewed by me and considered in my medical decision making (see chart for details).     Vitals:   08/16/17 1306 08/16/17 1307  BP: (!) 198/110   Pulse: 83   Resp: 20   Temp: 97.8 F (36.6 C)     TempSrc: Oral   SpO2: 100%   Weight:  83.9 kg (185 lb)  Height:  6\' 1"  (1.854 m)    Medications  albuterol (PROVENTIL HFA;VENTOLIN HFA) 108 (90 Base) MCG/ACT inhaler 2 puff (not administered)  aspirin chewable tablet 81 mg (not administered)  cyclobenzaprine (FLEXERIL) tablet 10 mg (not administered)  escitalopram (LEXAPRO) tablet 20 mg (not administered)  etodolac (LODINE) capsule 300 mg (not administered)  metoprolol tartrate (LOPRESSOR) tablet 25 mg (not administered)  QUEtiapine (SEROQUEL) tablet 100 mg (not administered)  tamsulosin (FLOMAX) capsule 0.4 mg (not administered)  ondansetron (ZOFRAN) tablet 4 mg (not administered)  nicotine (NICODERM CQ - dosed in mg/24 hours) patch 21 mg (not administered)  acetaminophen (TYLENOL) tablet 650 mg (not administered)  dicyclomine (BENTYL) tablet 20 mg (not administered)  hydrOXYzine (ATARAX/VISTARIL) tablet 25 mg (not administered)  loperamide (IMODIUM) capsule 2-4 mg (not administered)    Edward Little is 57 y.o. male presenting with suicidal ideation, no plan.  He has been noncompliant with his Lexapro.  He also has some auditory and visual hallucinations which are new for him.  Patient with opiate abuse, not in active withdrawal.  He is reporting productive cough with some shortness of breath, chest x-ray clear, lung sounds without rhonchi or wheezing.  Patient given albuterol inhaler for comfort, started him on opiate withdrawal detox protocol.  Pressure is elevated today, he has been noncompliant with his blood pressure medications.  No signs of endorgan damage or upper tensive emergency.  Mild transaminitis.  Patient is medically cleared for psychiatric evaluation will be transferred to the psych ED. TTS consulted, home meds and psych standard holding orders placed.     Final Clinical Impressions(s) / ED Diagnoses   Final diagnoses:  Suicidal ideation  Heroin addiction (HCC)  Non compliance w medication regimen    ED  Discharge Orders    None       Lynetta Mare Mardella Layman 08/16/17 1521    Janaa Acero, Joni Reining, PA-C 08/16/17 1522    Charlynne Pander, MD 08/16/17 [phone]

## 2017-08-16 NOTE — ED Triage Notes (Signed)
Pt finished TTS and behavior observed to be demanding and speech pressured.

## 2017-08-16 NOTE — ED Triage Notes (Signed)
TTS Done 

## 2017-08-16 NOTE — ED Notes (Signed)
[facility]

## 2017-08-16 NOTE — Care Management (Signed)
[facility]  Dr. Jama Flavorsobos is the accepting.  The number to call report is [phone].

## 2017-08-16 NOTE — ED Triage Notes (Signed)
Per Pt, Pt is coming from home with complaints of suicidal thoughts and hx of depression. Denies any plan to harm himself.   Complains of productive cough with yellow sputum x 3 days.

## 2017-08-17 ENCOUNTER — Other Ambulatory Visit: Payer: Self-pay

## 2017-08-17 ENCOUNTER — Encounter (HOSPITAL_COMMUNITY): Payer: Self-pay

## 2017-08-17 DIAGNOSIS — F17210 Nicotine dependence, cigarettes, uncomplicated: Secondary | ICD-10-CM

## 2017-08-17 DIAGNOSIS — G4700 Insomnia, unspecified: Secondary | ICD-10-CM

## 2017-08-17 DIAGNOSIS — F1120 Opioid dependence, uncomplicated: Secondary | ICD-10-CM

## 2017-08-17 DIAGNOSIS — R441 Visual hallucinations: Secondary | ICD-10-CM

## 2017-08-17 DIAGNOSIS — R450 Nervousness: Secondary | ICD-10-CM

## 2017-08-17 DIAGNOSIS — R440 Auditory hallucinations: Secondary | ICD-10-CM

## 2017-08-17 DIAGNOSIS — F322 Major depressive disorder, single episode, severe without psychotic features: Principal | ICD-10-CM

## 2017-08-17 DIAGNOSIS — F419 Anxiety disorder, unspecified: Secondary | ICD-10-CM

## 2017-08-17 MED ORDER — HYDROXYZINE HCL 50 MG PO TABS
50.0000 mg | ORAL_TABLET | Freq: Four times a day (QID) | ORAL | Status: DC | PRN
  Administered 2017-08-17 – 2017-08-19 (×3): 50 mg via ORAL
  Filled 2017-08-17 (×3): qty 1
  Filled 2017-08-17 (×2): qty 20
  Filled 2017-08-17: qty 1

## 2017-08-17 NOTE — Tx Team (Signed)
Initial Treatment Plan 08/17/2017 12:50 AM Edward Little [id number]    PATIENT STRESSORS: Financial difficulties Health problems Marital or family conflict Medication change or noncompliance Substance abuse   PATIENT STRENGTHS: Average or above average intelligence Communication skills General fund of knowledge   PATIENT IDENTIFIED PROBLEMS: "feel better about myself"  "get treatment for drugs"  Suicide Risk  Substance Abuse  Depression             DISCHARGE CRITERIA:  Ability to meet basic life and health needs Adequate post-discharge living arrangements Improved stabilization in mood, thinking, and/or behavior Medical problems require only outpatient monitoring Motivation to continue treatment in a less acute level of care Need for constant or close observation no longer present Reduction of life-threatening or endangering symptoms to within safe limits Safe-care adequate arrangements made Verbal commitment to aftercare and medication compliance Withdrawal symptoms are absent or subacute and managed without 24-hour nursing intervention  PRELIMINARY DISCHARGE PLAN: Outpatient therapy  PATIENT/FAMILY INVOLVEMENT: This treatment plan has been presented to and reviewed with the patient, Edward Little.  The patient and family have been given the opportunity to ask questions and make suggestions.  Ferrel LoganAmanda A Bastian Andreoli, RN 08/17/2017, 12:50 AM

## 2017-08-17 NOTE — Progress Notes (Signed)
Patient ID: Edward Little, male   DOB: Jan 24, 1961, 57 y.o.   MRN: [id number] PER STATE REGULATIONS 482.30  THIS CHART WAS REVIEWED FOR MEDICAL NECESSITY WITH RESPECT TO THE PATIENT'S ADMISSION/DURATION OF STAY.  NEXT REVIEW DATE: 08/20/17  Loura HaltBARBARA Dakoda Laventure, RN, BSN CASE MANAGER

## 2017-08-17 NOTE — Progress Notes (Signed)
Specimen cup provided to patient and is informed to return to staff as soon as possible. Patient agrees.

## 2017-08-17 NOTE — Progress Notes (Signed)
Admission Note  D) Patient admitted to the adult unit 300 hall. Patient is a 57 year old male who is Voluntary and was in no acute distress. Patient presents with sad, depressed mood buta was pleasant and cooperative during the admission process. Patient presented to the ED with thoughts of SI but no plan. Patient reports he is currently going through a divorce and has been using IV heroin. Patient states his last use was two days ago. Patient reports he was living at the [facility] but has been non-compliant with his medications due to financial issues. Patient denies allergies to food but reports drug allergies consistent with chart review. Past medical history includes HTN and an AAA. Patient currently reports passive SI but denies HI. Patient reports "seeing shadows" and "hearing my name" on occasion. While here, patient reports wanting to work on "feeling better about myself" and "getting drug treatment". Patient does appear to be anxious and withdrawing at this time.  Patient reports he ambulates with a cane at home but refuses a walker here. Patient states, "I will be fine if I can sit down when I need to". Patient is steady on his feet.  A) Skin assessment was completed. Patient has no wounds but does have several healing track marks and scratches to his arms bilaterally. Patient does have a distended abdomen with surgical scars present. Patient reports this is consistent with his baseline. Patient belongings searched with no contraband found. Belongings in locker #16. Plan of care, unit policies and patient expectations were explained. Patient receptive to information given with no questions. Patient verbalized understanding and contracted for safety on the unit. Written consents obtained. Vital signs monitored. Meal tray and fluids provided. Patient oriented to the unit and their room. Patient placed on standard q15 safety checks. High fall risk precautions initiated and reviewed with patient;  patient verbalized understanding. Patient medicated as prescribed.  R) Patient is in no acute distress. Patient remains safe on the unit at this time. Patient without questions or concerns at this time. Will continue to monitor.

## 2017-08-17 NOTE — Progress Notes (Signed)
Recreation Therapy Notes  Date: 08/17/17 Time: 0930 Location: [address] Dayroom  Group Topic: Stress Management  Goal Area(s) Addresses:  Patient will verbalize importance of using healthy stress management.  Patient will identify positive emotions associated with healthy stress management.   Intervention: Stress Management  Activity : Progressive Muscle Relaxation.  LRT introduced the stress management technique of progressive muscle relaxation.  LRT led patients through the technique which allowed them to tense and relax each muscle group individually.  Education:  Stress Management, Discharge Planning.   Education Outcome: Acknowledges edcuation/In group clarification offered/Needs additional education  Clinical Observations/Feedback: Pt did not attend group.     Caroll RancherMarjette Kester Stimpson, LRT/CTRS         Lillia AbedLindsay, Maekayla Giorgio A 08/17/2017 11:04 AM

## 2017-08-17 NOTE — [facility] Suicide Risk Assessment (Signed)
[facility] Admission Suicide Risk Assessment   Nursing information obtained from:  Patient Demographic factors:  Male, Caucasian, Low socioeconomic status, Unemployed Current Mental Status:  Suicidal ideation indicated by patient Loss Factors:  Financial problems / change in socioeconomic status, Loss of significant relationship Historical Factors:  NA Risk Reduction Factors:  Sense of responsibility to family  Total Time spent with patient: 1 hour Principal Problem: MDD (major depressive disorder), severe (HCC) Diagnosis:   Patient Active Problem List   Diagnosis Date Noted  . Opioid use disorder, severe, dependence (HCC) [F11.20] 08/17/2017  . MDD (major depressive disorder), severe (HCC) [F32.2] 08/16/2017   Subjective Data:   Edward Little is a 57 y/o M with history of MDD and opioid use disorder who was admitted voluntarily from [facility] ED with worsening depression, SI without plan, AH, VH, and worsening substance use of heroin.  Pt has no previous psychiatric admissions. Upon arrival to [facility], pt denied AH/VH, but he continued to endorse severe anxiety, depression, and SI without plan.  Upon interview, pt shares,"I've been feeling a depression, and it keeps getting worse and worse." He continues, "I can't deal with it any more; I feel full of despair." Pt endorses SI without specific plan, which is what prompted him to seek help. He denies HI/AH/VH. He reports he has been sleeping poorly. He endorses depressive symptoms of anhedonia, guilty feelings, low energy, poor concentration, poor appetite, decreased motivation, hopelessness, and decreased motivation. He denies symptoms of mania, OCD, and PTSD. He enordses use of $50-100 daily of heroin and he reports having an "off and on" pattern of usage over the past 6-7 years.  Discussed with patient about treatment options. He was already started on combination of lexapro and seroquel, and pt expresses that he would like to continue this current  regimen as he feels it has already been helpful. He will also be continued on the opioid withdrawal protocol. Pt agrees to discuss with SW team about substance use treatment options. He had no further questions, comments, or concerns.   Continued Clinical Symptoms:  Alcohol Use Disorder Identification Test Final Score (AUDIT): 0 The "Alcohol Use Disorders Identification Test", Guidelines for Use in Primary Care, Second Edition.  World Science writer [facility]). Score between 0-7:  no or low risk or alcohol related problems. Score between 8-15:  moderate risk of alcohol related problems. Score between 16-19:  high risk of alcohol related problems. Score 20 or above:  warrants further diagnostic evaluation for alcohol dependence and treatment.   CLINICAL FACTORS:   Severe Anxiety and/or Agitation Depression:   Comorbid alcohol abuse/dependence Alcohol/Substance Abuse/Dependencies More than one psychiatric diagnosis Unstable or Poor Therapeutic Relationship Previous Psychiatric Diagnoses and Treatments Medical Diagnoses and Treatments/Surgeries   Musculoskeletal: Strength & Muscle Tone: within normal limits Gait & Station: normal Patient leans: N/A  Psychiatric Specialty Exam: Physical Exam  Nursing note and vitals reviewed.   Review of Systems  Constitutional: Negative for chills and fever.  Respiratory: Negative for cough and shortness of breath.   Cardiovascular: Negative for chest pain.  Gastrointestinal: Negative for abdominal pain, heartburn, nausea and vomiting.  Psychiatric/Behavioral: Positive for depression, substance abuse and suicidal ideas. Negative for hallucinations. The patient is nervous/anxious.     Blood pressure 136/72, pulse 60, temperature 97.8 F (36.6 C), temperature source Oral, resp. rate 18, height 6\' 1"  (1.854 m), weight 81.6 kg (180 lb), SpO2 99 %.Body mass index is 23.75 kg/m.  General Appearance: Casual and Fairly Groomed  Eye Contact:  Good  Speech:  Clear and Coherent and Normal Rate  Volume:  Normal  Mood:  Anxious and Depressed  Affect:  Appropriate, Congruent and Constricted  Thought Process:  Coherent and Goal Directed  Orientation:  Full (Time, Place, and Person)  Thought Content:  Logical  Suicidal Thoughts:  Yes.  without intent/plan  Homicidal Thoughts:  No  Memory:  Immediate;   Good Recent;   Good Remote;   Good  Judgement:  Fair  Insight:  Lacking  Psychomotor Activity:  Normal  Concentration:  Concentration: Fair  Recall:  [employer]Fair  Fund of Knowledge:  Fair  Language:  Fair  Akathisia:  No  Handed:    AIMS (if indicated):     Assets:  Communication Skills Leisure Time Physical Health Resilience  ADL's:  Intact  Cognition:  WNL  Sleep:  Number of Hours: 5      COGNITIVE FEATURES THAT CONTRIBUTE TO RISK:  None    SUICIDE RISK:   Minimal: No identifiable suicidal ideation.  Patients presenting with no risk factors but with morbid ruminations; may be classified as minimal risk based on the severity of the depressive symptoms  PLAN OF CARE:   - Admit to inpatient level of care  -MDD  - Continue lexapro 20mg  po qDay   - Continue seroquel 100mg  po qhs  - Opioid use disorder/withdrawal  - Continue COWS with clonidine  - Anxiety  - Continue atarax 50mg  po q6h prn anxiety  -BPH  - Continue flomax 0.4mg  qDay  -Encourage participation in groups and the therapeutic milieu  -Discharge planning will be ongoing  I certify that inpatient services furnished can reasonably be expected to improve the patient's condition.   Micheal Likenshristopher T Helyn Schwan, MD 08/17/2017, 4:17 PM

## 2017-08-17 NOTE — Tx Team (Signed)
Interdisciplinary Treatment and Diagnostic Plan Update  08/17/2017 Time of Session: 0830AM Edward Little MRN: [id number]  Principal Diagnosis: MDD recurrent, severe  Secondary Diagnoses: Active Problems:   MDD (major depressive disorder), severe (HCC)   Current Medications:  Current Facility-Administered Medications  Medication Dose Route Frequency Provider Last Rate Last Dose  . albuterol (PROVENTIL HFA;VENTOLIN HFA) 108 (90 Base) MCG/ACT inhaler 2 puff  2 puff Inhalation Q4H PRN Rankin, Shuvon B, NP      . alum & mag hydroxide-simeth (MAALOX/MYLANTA) 200-200-20 MG/5ML suspension 30 mL  30 mL Oral Q4H PRN Rankin, Shuvon B, NP      . aspirin chewable tablet 81 mg  81 mg Oral Daily Rankin, Shuvon B, NP   81 mg at 08/17/17 0813  . cloNIDine (CATAPRES) tablet 0.1 mg  0.1 mg Oral QID Lindon Romp A, NP   0.1 mg at 08/17/17 0816   Followed by  . [START ON 08/19/2017] cloNIDine (CATAPRES) tablet 0.1 mg  0.1 mg Oral BH-qamhs Rozetta Nunnery, NP       Followed by  . [START ON 08/22/2017] cloNIDine (CATAPRES) tablet 0.1 mg  0.1 mg Oral QAC breakfast Lindon Romp A, NP      . cyclobenzaprine (FLEXERIL) tablet 10 mg  10 mg Oral BID PRN Rankin, Shuvon B, NP   10 mg at 08/17/17 0024  . dicyclomine (BENTYL) tablet 20 mg  20 mg Oral Q6H PRN Rankin, Shuvon B, NP      . escitalopram (LEXAPRO) tablet 20 mg  20 mg Oral Daily Rankin, Shuvon B, NP   20 mg at 08/17/17 6734  . hydrOXYzine (ATARAX/VISTARIL) tablet 25 mg  25 mg Oral Q6H PRN Rankin, Shuvon B, NP      . loperamide (IMODIUM) capsule 2-4 mg  2-4 mg Oral PRN Lindon Romp A, NP      . magnesium hydroxide (MILK OF MAGNESIA) suspension 30 mL  30 mL Oral Daily PRN Rankin, Shuvon B, NP      . metoprolol tartrate (LOPRESSOR) tablet 25 mg  25 mg Oral BID Rankin, Shuvon B, NP   25 mg at 08/17/17 0815  . nicotine (NICODERM CQ - dosed in mg/24 hours) patch 21 mg  21 mg Transdermal Daily Rankin, Shuvon B, NP   21 mg at 08/17/17 0815  . ondansetron (ZOFRAN-ODT)  disintegrating tablet 4 mg  4 mg Oral Q6H PRN Lindon Romp A, NP      . QUEtiapine (SEROQUEL) tablet 100 mg  100 mg Oral QHS Lindon Romp A, NP   100 mg at 08/17/17 0024  . tamsulosin (FLOMAX) capsule 0.4 mg  0.4 mg Oral Daily Rankin, Shuvon B, NP   0.4 mg at 08/17/17 0816   PTA Medications: Medications Prior to Admission  Medication Sig Dispense Refill Last Dose  . albuterol (PROVENTIL HFA;VENTOLIN HFA) 108 (90 Base) MCG/ACT inhaler Inhale 2 puffs into the lungs every 4 (four) hours as needed for wheezing. 18 g 3 unknown  . escitalopram (LEXAPRO) 20 MG tablet Take 1 tablet (20 mg total) by mouth daily. 90 tablet 0 Past Week at Unknown time  . metoprolol tartrate (LOPRESSOR) 25 MG tablet Take 1 tablet (25 mg total) by mouth 2 (two) times daily. 180 tablet 3 Past Week at Unknown time  . ondansetron (ZOFRAN) 4 MG tablet Take 1 tablet (4 mg total) every 6 (six) hours by mouth. (Patient not taking: Reported on 08/16/2017) 12 tablet 0 Completed Course at Unknown time  . QUEtiapine (SEROQUEL) 100 MG tablet Take 1  tablet (100 mg total) by mouth at bedtime. 90 tablet 0 Past Week at Unknown time    Patient Stressors: Financial difficulties Health problems Marital or family conflict Medication change or noncompliance Substance abuse  Patient Strengths: Average or above average intelligence Communication skills General fund of knowledge  Treatment Modalities: Medication Management, Group therapy, Case management,  1 to 1 session with clinician, Psychoeducation, Recreational therapy.   Physician Treatment Plan for Primary Diagnosis:  MDD recurrent, severe  Medication Management: Evaluate patient's response, side effects, and tolerance of medication regimen.  Therapeutic Interventions: 1 to 1 sessions, Unit Group sessions and Medication administration.  Evaluation of Outcomes: Not Met  Physician Treatment Plan for Secondary Diagnosis: Active Problems:   MDD (major depressive disorder), severe  ([place])   Medication Management: Evaluate patient's response, side effects, and tolerance of medication regimen.  Therapeutic Interventions: 1 to 1 sessions, Unit Group sessions and Medication administration.  Evaluation of Outcomes: Not Met   RN Treatment Plan for Primary Diagnosis:  MDD recurrent, severe Long Term Goal(s): Knowledge of disease and therapeutic regimen to maintain health will improve  Short Term Goals: Ability to remain free from injury will improve, Ability to disclose and discuss suicidal ideas and Ability to identify and develop effective coping behaviors will improve  Medication Management: RN will administer medications as ordered by provider, will assess and evaluate patient's response and provide education to patient for prescribed medication. RN will report any adverse and/or side effects to prescribing provider.  Therapeutic Interventions: 1 on 1 counseling sessions, Psychoeducation, Medication administration, Evaluate responses to treatment, Monitor vital signs and CBGs as ordered, Perform/monitor CIWA, COWS, AIMS and Fall Risk screenings as ordered, Perform wound care treatments as ordered.  Evaluation of Outcomes: Not Met   LCSW Treatment Plan for Primary Diagnosis: MDD recurrent, severe Long Term Goal(s): Safe transition to appropriate next level of care at discharge, Engage patient in therapeutic group addressing interpersonal concerns.  Short Term Goals: Engage patient in aftercare planning with referrals and resources, Facilitate patient progression through stages of change regarding substance use diagnoses and concerns and Identify triggers associated with mental health/substance abuse issues  Therapeutic Interventions: Assess for all discharge needs, 1 to 1 time with Social worker, Explore available resources and support systems, Assess for adequacy in community support network, Educate family and significant other(s) on suicide prevention, Complete  Psychosocial Assessment, Interpersonal group therapy.  Evaluation of Outcomes: Not Met   Progress in Treatment: Attending groups: No. New to unit. Continuing to assess.  Participating in groups: No. Taking medication as prescribed: Yes Toleration medication: Yes. Family/Significant other contact made: No, will contact:  family member if patient consents to collateral contact. Patient understands diagnosis: Yes. Discussing patient identified problems/goals with staff: Yes. Medical problems stabilized or resolved: Yes. Denies suicidal/homicidal ideation: Yes. Issues/concerns per patient self-inventory: No. Other: n/a   New problem(s) identified: No, Describe:  n/a  New Short Term/Long Term Goal(s): detox, medication management for mood stabilization; elimination of SI thoughts; development of comprehensive mental wellness/sobriety plan.   Patient Goal: " to try to beat some of my depression and feel better."   Discharge Plan or Barriers: CSW assessing for appropriate referrals. This is patient first admission.   Reason for Continuation of Hospitalization: Anxiety Depression Medication stabilization Suicidal ideation Withdrawal symptoms  Estimated Length of Stay: Monday, 08/20/17  Attendees: Patient: 08/17/2017 9:03 AM  Physician: Dr. Parke Poisson MD; Dr. Nancy Fetter MD 08/17/2017 9:03 AM  Nursing: Bryna Colander RN 08/17/2017 9:03 AM  RN Care Manager:x  08/17/2017 9:03 AM  Social Worker: [place], LCSW 08/17/2017 9:03 AM  Recreational Therapist: x 08/17/2017 9:03 AM  Other: Lindell Spar NP; Darnelle Maffucci Money NP 08/17/2017 9:03 AM  Other:  08/17/2017 9:03 AM  Other: 08/17/2017 9:03 AM    Scribe for Treatment Team: [place], LCSW 08/17/2017 9:03 AM

## 2017-08-17 NOTE — Plan of Care (Signed)
Pt has been in bed this morning not attending groups detoxing from heroin.

## 2017-08-17 NOTE — Progress Notes (Signed)
Adult Psychoeducational Group Note  Date:  08/17/2017 Time:  11:28 AM  Group Topic/Focus:  Relapse Prevention Planning:   The focus of this group is to define relapse and discuss the need for planning to combat relapse.  Participation Level:  Did Not Attend   Philip AspenLatoya O Bryant Saye 08/17/2017, 11:28 AM

## 2017-08-17 NOTE — H&P (Signed)
Psychiatric Admission Assessment Adult  Patient Identification: Edward Little MRN:  [id number]  Date of Evaluation:  08/17/2017  Chief Complaint: Worsening depression triggering suicidal ideations.  Principal Diagnosis: Opioid use disorder, severe, dependence ([place])  Diagnosis:   Patient Active Problem List   Diagnosis Date Noted  . Opioid use disorder, severe, dependence ([place]) [F11.20] 08/17/2017  . MDD (major depressive disorder), severe ([place]) [F32.2] 08/16/2017   History of Present Illness: This is the first admission assessment for this 57 year old Caucasian male with hx of depression & opioid use disorder. Admitted to the [facility] adult unit from the [facility] with complaints of auditory hallucinations, Visual hallucinations (seeing shadows) & opioid use disorder, chronic. His most recent lab reports indicated elevated liver enzymes (AST, ALT), patient reports that he was using more IV heroin that he drinks alcohol. He came to the hospital be made to feel good about himself again.  During this assessment, Edward Little reports, "Someone dropped me off at the [facility] ED yesterday. I was feeling very depressed & suicidal. This has been going on for several weeks now. I did not try to hurt myself, I sort help instead. I have been on Lexapro for depression for a while. It is not helping my depression any more. I have not been on my medicines for several weeks or months, I guess I ran out of them. I need you guys to help me feel better about myself again. I have been using heroin on a daily basis for several months intravenously, but, been a heroin addict for 8 years. I'm disabled due to bad medical problems. I'm no longer feeling suicidal. I have never heard voices or see things".  Associated Signs/Symptoms:  Depression Symptoms:  depressed mood, insomnia, anxiety,  (Hypo) Manic Symptoms:  Impulsivity,  Anxiety Symptoms:  Excessive Worry,  Psychotic Symptoms:  Denies any  hallucinations, delusions or paranoia  PTSD Symptoms: Denies any PTSD symptoms or events  Total Time spent with patient: 1 hour  Past Psychiatric History: Major depression, Opioid use disorder.  Is the patient at risk to self? No.  Has the patient been a risk to self in the past 6 months? Yes.    Has the patient been a risk to self within the distant past? No.  Is the patient a risk to others? No.  Has the patient been a risk to others in the past 6 months? No.  Has the patient been a risk to others within the distant past? No.   Prior Inpatient Therapy: Yes, [place], 2018. Prior Outpatient Therapy: None reported  Alcohol Screening: 1. How often do you have a drink containing alcohol?: Never 2. How many drinks containing alcohol do you have on a typical day when you are drinking?: 1 or 2 3. How often do you have six or more drinks on one occasion?: Never AUDIT-C Score: 0 9. Have you or someone else been injured as a result of your drinking?: No 10. Has a relative or friend or a doctor or another health worker been concerned about your drinking or suggested you cut down?: No Alcohol Use Disorder Identification Test Final Score (AUDIT): 0 Intervention/Follow-up: AUDIT Score <7 follow-up not indicated  Substance Abuse History in the last 12 months:  Yes.    Consequences of Substance Abuse: Medical Consequences:  Liver damage, Possible death by overdose Legal Consequences:  Arrests, jail time, Loss of driving privilege. Family Consequences:  Family discord, divorce and or separation.  Previous Psychotropic Medications: Yes  Psychological Evaluations: No   Past Medical History:  Past Medical History:  Diagnosis Date  . AAA (abdominal aortic aneurysm) ([place])   . Cellulitis     Past Surgical History:  Procedure Laterality Date  . ABDOMINAL AORTIC ANEURYSM REPAIR W/ ENDOLUMINAL GRAFT    . revision of AAA graft     Family History: History reviewed. No pertinent family  history.  Family Psychiatric  History: Denies any hx of mental health issues.  Tobacco Screening: Have you used any form of tobacco in the last 30 days? (Cigarettes, Smokeless Tobacco, Cigars, and/or Pipes): Yes Tobacco use, Select all that apply: 5 or more cigarettes per day Are you interested in Tobacco Cessation Medications?: Yes, will notify MD for an order Counseled patient on smoking cessation including recognizing danger situations, developing coping skills and basic information about quitting provided: Refused/Declined practical counseling  Social History:  Social History   Substance and Sexual Activity  Alcohol Use No     Social History   Substance and Sexual Activity  Drug Use Yes   Comment: heroine    Additional Social History: Marital status: Separated Separated, when?: 2-3 years. married over 30 years What types of issues is patient dealing with in the relationship?: substance use was a big part of it. Additional relationship information: n/a  Are you sexually active?: Yes What is your sexual orientation?: heterosexual Has your sexual activity been affected by drugs, alcohol, medication, or emotional stress?: n/a Does patient have children?: Yes How many children?: 1 How is patient's relationship with their children?: 24 yo daughter. "We talk but aren't real close."  Allergies:   Allergies  Allergen Reactions  . Iodinated Diagnostic Agents Hives  . Ketorolac Tromethamine Diarrhea    Stomach cramps  . Tape Itching   Lab Results:  Results for orders placed or performed during the hospital encounter of 08/16/17 (from the past 48 hour(s))  Comprehensive metabolic panel     Status: Abnormal   Collection Time: 08/16/17  1:06 PM  Result Value Ref Range   Sodium 135 135 - 145 mmol/L   Potassium 3.7 3.5 - 5.1 mmol/L   Chloride 102 101 - 111 mmol/L   CO2 22 22 - 32 mmol/L   Glucose, Bld 128 (H) 65 - 99 mg/dL   BUN 9 6 - 20 mg/dL   Creatinine, Ser 0.84 0.61 -  1.24 mg/dL   Calcium 9.7 8.9 - 10.3 mg/dL   Total Protein 8.2 (H) 6.5 - 8.1 g/dL   Albumin 3.7 3.5 - 5.0 g/dL   AST 64 (H) 15 - 41 U/L   ALT 66 (H) 17 - 63 U/L   Alkaline Phosphatase 99 38 - 126 U/L   Total Bilirubin 1.3 (H) 0.3 - 1.2 mg/dL   GFR calc non Af Amer >60 >60 mL/min   GFR calc Af Amer >60 >60 mL/min    Comment: (NOTE) The eGFR has been calculated using the CKD EPI equation. This calculation has not been validated in all clinical situations. eGFR's persistently <60 mL/min signify possible Chronic Kidney Disease.    Anion gap 11 5 - 15  Ethanol     Status: None   Collection Time: 08/16/17  1:06 PM  Result Value Ref Range   Alcohol, Ethyl (B) <10 <10 mg/dL    Comment:        LOWEST DETECTABLE LIMIT FOR SERUM ALCOHOL IS 10 mg/dL FOR MEDICAL PURPOSES ONLY   Salicylate level     Status: None   Collection Time:  08/16/17  1:06 PM  Result Value Ref Range   Salicylate Lvl <2.9 2.8 - 30.0 mg/dL  Acetaminophen level     Status: Abnormal   Collection Time: 08/16/17  1:06 PM  Result Value Ref Range   Acetaminophen (Tylenol), Serum <10 (L) 10 - 30 ug/mL    Comment:        THERAPEUTIC CONCENTRATIONS VARY SIGNIFICANTLY. A RANGE OF 10-30 ug/mL MAY BE AN EFFECTIVE CONCENTRATION FOR MANY PATIENTS. HOWEVER, SOME ARE BEST TREATED AT CONCENTRATIONS OUTSIDE THIS RANGE. ACETAMINOPHEN CONCENTRATIONS >150 ug/mL AT 4 HOURS AFTER INGESTION AND >50 ug/mL AT 12 HOURS AFTER INGESTION ARE OFTEN ASSOCIATED WITH TOXIC REACTIONS.   cbc     Status: Abnormal   Collection Time: 08/16/17  1:06 PM  Result Value Ref Range   WBC 5.0 4.0 - 10.5 K/uL   RBC 5.80 4.22 - 5.81 MIL/uL   Hemoglobin 15.7 13.0 - 17.0 g/dL   HCT 46.0 39.0 - 52.0 %   MCV 79.3 78.0 - 100.0 fL   MCH 27.1 26.0 - 34.0 pg   MCHC 34.1 30.0 - 36.0 g/dL   RDW 14.9 11.5 - 15.5 %   Platelets 128 (L) 150 - 400 K/uL   Blood Alcohol level:  Lab Results  Component Value Date   ETH <10 08/16/2017   ETH <10 56/21/3086    Metabolic Disorder Labs:  No results found for: HGBA1C, MPG No results found for: PROLACTIN No results found for: CHOL, TRIG, HDL, CHOLHDL, VLDL, LDLCALC  Current Medications: Current Facility-Administered Medications  Medication Dose Route Frequency Provider Last Rate Last Dose  . albuterol (PROVENTIL HFA;VENTOLIN HFA) 108 (90 Base) MCG/ACT inhaler 2 puff  2 puff Inhalation Q4H PRN Rankin, Shuvon B, NP      . alum & mag hydroxide-simeth (MAALOX/MYLANTA) 200-200-20 MG/5ML suspension 30 mL  30 mL Oral Q4H PRN Rankin, Shuvon B, NP      . aspirin chewable tablet 81 mg  81 mg Oral Daily Rankin, Shuvon B, NP   81 mg at 08/17/17 0813  . cloNIDine (CATAPRES) tablet 0.1 mg  0.1 mg Oral QID Lindon Romp A, NP   0.1 mg at 08/17/17 1209   Followed by  . [START ON 08/19/2017] cloNIDine (CATAPRES) tablet 0.1 mg  0.1 mg Oral BH-qamhs Rozetta Nunnery, NP       Followed by  . [START ON 08/22/2017] cloNIDine (CATAPRES) tablet 0.1 mg  0.1 mg Oral QAC breakfast Lindon Romp A, NP      . cyclobenzaprine (FLEXERIL) tablet 10 mg  10 mg Oral BID PRN Rankin, Shuvon B, NP   10 mg at 08/17/17 0024  . dicyclomine (BENTYL) tablet 20 mg  20 mg Oral Q6H PRN Rankin, Shuvon B, NP      . escitalopram (LEXAPRO) tablet 20 mg  20 mg Oral Daily Rankin, Shuvon B, NP   20 mg at 08/17/17 5784  . hydrOXYzine (ATARAX/VISTARIL) tablet 25 mg  25 mg Oral Q6H PRN Rankin, Shuvon B, NP      . loperamide (IMODIUM) capsule 2-4 mg  2-4 mg Oral PRN Lindon Romp A, NP      . magnesium hydroxide (MILK OF MAGNESIA) suspension 30 mL  30 mL Oral Daily PRN Rankin, Shuvon B, NP      . metoprolol tartrate (LOPRESSOR) tablet 25 mg  25 mg Oral BID Rankin, Shuvon B, NP   25 mg at 08/17/17 0815  . nicotine (NICODERM CQ - dosed in mg/24 hours) patch 21 mg  21 mg  Transdermal Daily Rankin, Shuvon B, NP   21 mg at 08/17/17 0815  . ondansetron (ZOFRAN-ODT) disintegrating tablet 4 mg  4 mg Oral Q6H PRN Lindon Romp A, NP      . QUEtiapine (SEROQUEL) tablet  100 mg  100 mg Oral QHS Lindon Romp A, NP   100 mg at 08/17/17 0024  . tamsulosin (FLOMAX) capsule 0.4 mg  0.4 mg Oral Daily Rankin, Shuvon B, NP   0.4 mg at 08/17/17 0816   PTA Medications: Medications Prior to Admission  Medication Sig Dispense Refill Last Dose  . albuterol (PROVENTIL HFA;VENTOLIN HFA) 108 (90 Base) MCG/ACT inhaler Inhale 2 puffs into the lungs every 4 (four) hours as needed for wheezing. 18 g 3 unknown  . escitalopram (LEXAPRO) 20 MG tablet Take 1 tablet (20 mg total) by mouth daily. 90 tablet 0 Past Week at Unknown time  . metoprolol tartrate (LOPRESSOR) 25 MG tablet Take 1 tablet (25 mg total) by mouth 2 (two) times daily. 180 tablet 3 Past Week at Unknown time  . ondansetron (ZOFRAN) 4 MG tablet Take 1 tablet (4 mg total) every 6 (six) hours by mouth. (Patient not taking: Reported on 08/16/2017) 12 tablet 0 Completed Course at Unknown time  . QUEtiapine (SEROQUEL) 100 MG tablet Take 1 tablet (100 mg total) by mouth at bedtime. 90 tablet 0 Past Week at Unknown time   Musculoskeletal: Strength & Muscle Tone: within normal limits Gait & Station: normal Patient leans: N/A  Psychiatric Specialty Exam: Physical Exam  Constitutional: He appears well-developed.  HENT:  Head: Normocephalic.  Eyes: Pupils are equal, round, and reactive to light.  Neck: Normal range of motion.  Cardiovascular:  Hx. HTN, AAA, PAD  Respiratory: Effort normal.  HX. COPD  GI:  Hx. AAA  Genitourinary:  Genitourinary Comments: Hx. BPH  Musculoskeletal: Normal range of motion.  Neurological: He is alert.  Skin: Skin is warm.    Review of Systems  Constitutional: Positive for chills, diaphoresis and malaise/fatigue.  HENT: Negative.   Eyes: Negative.   Respiratory:       HX. COPD  Cardiovascular: Negative.        Hx. AAA, PAD, COPD,   Gastrointestinal: Negative.   Genitourinary: Negative.   Musculoskeletal: Positive for myalgias.  Skin: Negative.   Neurological: Negative.    Endo/Heme/Allergies: Negative.   Psychiatric/Behavioral: Positive for depression, hallucinations and substance abuse (Reported Opioid addiction). Negative for memory loss and suicidal ideas. The patient is nervous/anxious and has insomnia.     Blood pressure 136/72, pulse 60, temperature 97.8 F (36.6 C), temperature source Oral, resp. rate 18, height '6\' 1"'  (1.854 m), weight 81.6 kg (180 lb), SpO2 99 %.Body mass index is 23.75 kg/m.  General Appearance: Disheveled  Eye Contact:  Fair  Speech:  Clear and Coherent and Normal Rate  Volume:  Normal  Mood:  Anxious, Depressed and Irritable  Affect:  Restricted  Thought Process:  Coherent, Goal Directed and Descriptions of Associations: Intact  Orientation:  Full (Time, Place, and Person)  Thought Content:  Logical, denies any hallucinations, delusions or paranoia  Suicidal Thoughts:  Currently denies any thoughts, plans or intent  Homicidal Thoughts:  Denies  Memory:  Immediate;   Good Recent;   Good Remote;   Good  Judgement:  Fair  Insight:  Fair  Psychomotor Activity:  Irritatble, agitated  Concentration:  Concentration: Fair and Attention Span: Fair  Recall:  Good  Fund of Knowledge:  Good  Language:  Good  Akathisia:  Negative  Handed:  Right  AIMS (if indicated):     Assets:  Communication Skills Desire for Improvement  ADL's:  Intact  Cognition:  WNL  Sleep:  Number of Hours: 5   Treatment Plan/Recommendations: 1. Admit for crisis management and stabilization, estimated length of stay 3-5 days.   2. Medication management to reduce current symptoms to base line and improve the patient's overall level of functioning: See MAR, Md's SRA & treatment plan.   3. Treat health problems as indicated.  4. Develop treatment plan to decrease risk of relapse upon discharge and the need for readmission.  5. Psycho-social education regarding relapse prevention and self care.  6. Health care follow up as needed for medical problems.   7. Review, reconcile, and reinstate any pertinent home medications for other health issues where appropriate. 8. Call for consults with hospitalist for any additional specialty patient care services as needed.  Observation Level/Precautions:  15 minute checks  Laboratory:  Per ED  Psychotherapy: Group sessions   Medications: See [facility]   Consultations: As needed  Discharge Concerns: Safety, mood stability   Estimated LOS: 3-5 days  Other: Admit to the 300-hall   Physician Treatment Plan for Primary Diagnosis: Opioid use disorder, severe, dependence ([place])  Long Term Goal(s): Improvement in symptoms so as ready for discharge  Short Term Goals: Ability to identify changes in lifestyle to reduce recurrence of condition will improve and Ability to demonstrate self-control will improve  Physician Treatment Plan for Secondary Diagnosis: Principal Problem:   Opioid use disorder, severe, dependence ([place]) Active Problems:   MDD (major depressive disorder), severe ([place])  Long Term Goal(s): Improvement in symptoms so as ready for discharge  Short Term Goals: Ability to identify and develop effective coping behaviors will improve, Compliance with prescribed medications will improve and Ability to identify triggers associated with substance abuse/mental health issues will improve  I certify that inpatient services furnished can reasonably be expected to improve the patient's condition.    Lindell Spar, NP, PMHNP, FNP-BC 1/25/20191:32 PM   I have reviewed NP's Note, assessement, diagnosis and plan, and agree. I have also met with patient and completed suicide risk assessment.  Edward Little is a 58 y/o M with history of MDD and opioid use disorder who was admitted voluntarily from [facility] ED with worsening depression, SI without plan, AH, VH, and worsening substance use of heroin.  Pt has no previous psychiatric admissions. Upon arrival to [facility], pt denied [place], but he continued to endorse severe  anxiety, depression, and SI without plan.  Upon interview, pt shares,"I've been feeling a depression, and it keeps getting worse and worse." He continues, "I can't deal with it any more; I feel full of despair." Pt endorses SI without specific plan, which is what prompted him to seek help. He denies HI/AH/VH. He reports he has been sleeping poorly. He endorses depressive symptoms of anhedonia, guilty feelings, low energy, poor concentration, poor appetite, decreased motivation, hopelessness, and decreased motivation. He denies symptoms of mania, OCD, and PTSD. He enordses use of $50-100 daily of heroin and he reports having an "off and on" pattern of usage over the past 6-7 years.  Discussed with patient about treatment options. He was already started on combination of lexapro and seroquel, and pt expresses that he would like to continue this current regimen as he feels it has already been helpful. He will also be continued on the opioid withdrawal protocol. Pt agrees to discuss with SW team about substance use treatment  options. He had no further questions, comments, or concerns.   PLAN OF CARE:   - Admit to inpatient level of care  -MDD             - Continue lexapro 70m po qDay             - Continue seroquel 1050mpo qhs  - Opioid use disorder/withdrawal             - Continue COWS with clonidine  - Anxiety             - Continue atarax 5014mo q6h prn anxiety  -BPH             - Continue flomax 0.4mg38may  -Encourage participation in groups and the therapeutic milieu  -Discharge planning will be ongoing    ChriMaris Berger
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
## 2017-08-17 NOTE — Progress Notes (Signed)
D:Pt has been in bed this morning. He got out of bed to take his morning medication and to talk with his treatment team. Pt is having withdrawal symptoms from heroin. He has a flat affect and is tolerating his medications well.  A:Offered support, encouragement and 15 minute checks.  R:Pt denies si and hi this morning stating "not yet" when asked about thoughts to harm himself. He is currently not showing symptoms of hallucinations. Safety maintained on the unit.

## 2017-08-17 NOTE — [facility] Group Notes (Signed)
LCSW Group Therapy Note  08/17/2017 1:15pm  Type of Therapy and Topic:  Group Therapy:  Feelings around Relapse and Recovery  Participation Level:  Did Not Attend--pt invited. Chose to remain in bed.    Description of Group:    Patients in this group will discuss emotions they experience before and after a relapse. They will process how experiencing these feelings, or avoidance of experiencing them, relates to having a relapse. Facilitator will guide patients to explore emotions they have related to recovery. Patients will be encouraged to process which emotions are more powerful. They will be guided to discuss the emotional reaction significant others in their lives may have to their relapse or recovery. Patients will be assisted in exploring ways to respond to the emotions of others without this contributing to a relapse.  Therapeutic Goals: 1. Patient will identify two or more emotions that lead to a relapse for them 2. Patient will identify two emotions that result when they relapse 3. Patient will identify two emotions related to recovery 4. Patient will demonstrate ability to communicate their needs through discussion and/or role plays   Summary of Patient Progress:  x   Therapeutic Modalities:   Cognitive Behavioral Therapy Solution-Focused Therapy Assertiveness Training Relapse Prevention Therapy   Ledell PeoplesHeather N Smart, LCSW 08/17/2017 11:28 AM

## 2017-08-18 DIAGNOSIS — F39 Unspecified mood [affective] disorder: Secondary | ICD-10-CM

## 2017-08-18 LAB — RAPID URINE DRUG SCREEN, HOSP PERFORMED
Amphetamines: POSITIVE — AB
BARBITURATES: NOT DETECTED
Benzodiazepines: NOT DETECTED
COCAINE: NOT DETECTED
OPIATES: NOT DETECTED
Tetrahydrocannabinol: NOT DETECTED

## 2017-08-18 NOTE — Progress Notes (Signed)
Pt did not attend evening AA group, remained in bed resting.

## 2017-08-18 NOTE — Plan of Care (Signed)
  Progressing Education: Emotional status will improve 08/18/2017 1121 - Progressing by Angela AdamBeaudry, Tilman Mcclaren E, RN Mental status will improve 08/18/2017 1121 - Progressing by Angela AdamBeaudry, Kiyona Mcnall E, RN Verbalization of understanding the information provided will improve 08/18/2017 1121 - Progressing by Angela AdamBeaudry, Mikaiya Tramble E, RN

## 2017-08-18 NOTE — Progress Notes (Signed)
D.  Pt pleasant on approach, denies complaints at this time.  Pt did not attend evening AA group, remained in bed.  Pt did get up for snacks later.  Pt denies SI/HI/AVH at this time.  A.  Support and encouragement offered, medication given as ordered  R. Pt remains safe on the unit, will continue to monitor.

## 2017-08-18 NOTE — Progress Notes (Signed)
D: Patient presents with flat affect with depressed and sad mood.  He is compliant with his medications and has minimal withdrawal symptoms.  He appears tremulous and anxious.  Patient has not been sleeping well and continues to endorse depressive symptoms.  He denies any thoughts of self harm today.  He denies any auditory/visual hallucinations today.  His clonidine and metropolol were held due to low BP.  Will recheck at 1200.  Patient is currently living in an [facility] and relapsed on heroin.  His COWS is a 4.  A: Continue to monitor medication management and MD orders.  Safety checks continued every 15 minutes per protocol.  Offer support and encouragement as needed.  R: Patient is receptive to staff; his behavior is appropriate.

## 2017-08-18 NOTE — Progress Notes (Signed)
Pt in bed most of the evening.  C/o moderate withdrawal symptoms.  Pt denies SI/HI/AVH at this time.  Pt makes his needs known to staff.  Meds given as ordered.  Support and encouragement offered.  Discharge plans are in process.  Pt wants to go to long term rehab after detox.  Safety maintained with q15 minute checks/

## 2017-08-18 NOTE — Progress Notes (Signed)
[facility] MD Progress Note  08/18/2017 10:56 AM Edward Little  MRN:  [id number]   Subjective:  Patient reports that he is doing a little better today. He reports minimal withdrawal symptoms and rates his depression at 5/10 and anxiety 5/10. He denies any SI/HI/AVH and contracts for safety. He reports having some disrupted sleep, but mainly due to environment.   Objective: Patient's chart and findings reviewed and discussed with treatment team. Patient presents in his room calm and cooperative. He has been attending groups. Will continue current medication regimen.   Principal Problem: MDD (major depressive disorder), severe (HCC) Diagnosis:   Patient Active Problem List   Diagnosis Date Noted  . Opioid use disorder, severe, dependence (HCC) [F11.20] 08/17/2017  . MDD (major depressive disorder), severe (HCC) [F32.2] 08/16/2017   Total Time spent with patient: 15 minutes  Past Psychiatric History: See H&P  Past Medical History:  Past Medical History:  Diagnosis Date  . AAA (abdominal aortic aneurysm) (HCC)   . Cellulitis     Past Surgical History:  Procedure Laterality Date  . ABDOMINAL AORTIC ANEURYSM REPAIR W/ ENDOLUMINAL GRAFT    . revision of AAA graft     Family History: History reviewed. No pertinent family history. Family Psychiatric  History: See H&P Social History:  Social History   Substance and Sexual Activity  Alcohol Use No     Social History   Substance and Sexual Activity  Drug Use Yes   Comment: heroine    Social History   Socioeconomic History  . Marital status: Legally Separated    Spouse name: None  . Number of children: None  . Years of education: None  . Highest education level: None  Social Needs  . Financial resource strain: None  . Food insecurity - worry: None  . Food insecurity - inability: None  . Transportation needs - medical: None  . Transportation needs - non-medical: None  Occupational History  . None  Tobacco Use  . Smoking status:  Current Every Day Smoker    Packs/day: 1.00    Types: Cigarettes  . Smokeless tobacco: Current User  Substance and Sexual Activity  . Alcohol use: No  . Drug use: Yes    Comment: heroine  . Sexual activity: None  Other Topics Concern  . None  Social History Narrative  . None   Additional Social History:                         Sleep: Fair  Appetite:  Fair  Current Medications: Current Facility-Administered Medications  Medication Dose Route Frequency Provider Last Rate Last Dose  . albuterol (PROVENTIL HFA;VENTOLIN HFA) 108 (90 Base) MCG/ACT inhaler 2 puff  2 puff Inhalation Q4H PRN Rankin, Shuvon B, NP      . alum & mag hydroxide-simeth (MAALOX/MYLANTA) 200-200-20 MG/5ML suspension 30 mL  30 mL Oral Q4H PRN Rankin, Shuvon B, NP      . aspirin chewable tablet 81 mg  81 mg Oral Daily Rankin, Shuvon B, NP   81 mg at 08/18/17 0813  . cloNIDine (CATAPRES) tablet 0.1 mg  0.1 mg Oral QID Jackelyn Poling, NP   Stopped at 08/18/17 0816   Followed by  . [START ON 08/19/2017] cloNIDine (CATAPRES) tablet 0.1 mg  0.1 mg Oral BH-qamhs Jackelyn Poling, NP       Followed by  . [START ON 08/22/2017] cloNIDine (CATAPRES) tablet 0.1 mg  0.1 mg Oral QAC breakfast Jackelyn Poling,  NP      . cyclobenzaprine (FLEXERIL) tablet 10 mg  10 mg Oral BID PRN Rankin, Shuvon B, NP   10 mg at 08/18/17 0312  . dicyclomine (BENTYL) tablet 20 mg  20 mg Oral Q6H PRN Rankin, Shuvon B, NP   20 mg at 08/17/17 2004  . escitalopram (LEXAPRO) tablet 20 mg  20 mg Oral Daily Rankin, Shuvon B, NP   20 mg at 08/18/17 0813  . hydrOXYzine (ATARAX/VISTARIL) tablet 50 mg  50 mg Oral Q6H PRN Micheal Likensainville, Christopher T, MD   50 mg at 08/18/17 [id number]  . loperamide (IMODIUM) capsule 2-4 mg  2-4 mg Oral PRN Nira ConnBerry, Jason A, NP      . magnesium hydroxide (MILK OF MAGNESIA) suspension 30 mL  30 mL Oral Daily PRN Rankin, Shuvon B, NP      . metoprolol tartrate (LOPRESSOR) tablet 25 mg  25 mg Oral BID Rankin, Shuvon B, NP   Stopped at  08/18/17 0823  . nicotine (NICODERM CQ - dosed in mg/24 hours) patch 21 mg  21 mg Transdermal Daily Rankin, Shuvon B, NP   21 mg at 08/18/17 0814  . ondansetron (ZOFRAN-ODT) disintegrating tablet 4 mg  4 mg Oral Q6H PRN Nira ConnBerry, Jason A, NP      . QUEtiapine (SEROQUEL) tablet 100 mg  100 mg Oral QHS Nira ConnBerry, Jason A, NP   100 mg at 08/17/17 2142  . tamsulosin (FLOMAX) capsule 0.4 mg  0.4 mg Oral Daily Rankin, Shuvon B, NP   0.4 mg at 08/18/17 [id number]    Lab Results:  Results for orders placed or performed during the hospital encounter of 08/16/17 (from the past 48 hour(s))  Rapid urine drug screen (hospital performed)     Status: Abnormal   Collection Time: 08/18/17  3:01 AM  Result Value Ref Range   Opiates NONE DETECTED NONE DETECTED   Cocaine NONE DETECTED NONE DETECTED   Benzodiazepines NONE DETECTED NONE DETECTED   Amphetamines POSITIVE (A) NONE DETECTED   Tetrahydrocannabinol NONE DETECTED NONE DETECTED   Barbiturates NONE DETECTED NONE DETECTED    Comment: (NOTE) DRUG SCREEN FOR MEDICAL PURPOSES ONLY.  IF CONFIRMATION IS NEEDED FOR ANY PURPOSE, NOTIFY LAB WITHIN 5 DAYS. LOWEST DETECTABLE LIMITS FOR URINE DRUG SCREEN Drug Class                     Cutoff (ng/mL) Amphetamine and metabolites    1000 Barbiturate and metabolites    200 Benzodiazepine                 200 Tricyclics and metabolites     300 Opiates and metabolites        300 Cocaine and metabolites        300 THC                            50 Performed at [facility], [address] [place], [place] [id number]     Blood Alcohol level:  Lab Results  Component Value Date   ETH <10 08/16/2017   ETH <10 06/08/2017    Metabolic Disorder Labs: No results found for: HGBA1C, MPG No results found for: PROLACTIN No results found for: CHOL, TRIG, HDL, CHOLHDL, VLDL, LDLCALC  Physical Findings: AIMS: Facial and Oral Movements Muscles of Facial Expression: None, normal Lips and Perioral Area: None,  normal Jaw: None, normal Tongue: None, normal,Extremity Movements Upper (arms, wrists, hands, fingers): None,  normal Lower (legs, knees, ankles, toes): None, normal, Trunk Movements Neck, shoulders, hips: None, normal, Overall Severity Severity of abnormal movements (highest score from questions above): None, normal Incapacitation due to abnormal movements: None, normal Patient's awareness of abnormal movements (rate only patient's report): No Awareness, Dental Status Current problems with teeth and/or dentures?: No Does patient usually wear dentures?: No  CIWA:    COWS:  COWS Total Score: 3  Musculoskeletal: Strength & Muscle Tone: within normal limits Gait & Station: normal Patient leans: N/A  Psychiatric Specialty Exam: Physical Exam  Nursing note and vitals reviewed. Constitutional: He is oriented to person, place, and time. He appears well-developed and well-nourished.  Respiratory: Effort normal.  Musculoskeletal: Normal range of motion.  Neurological: He is alert and oriented to person, place, and time.  Skin: Skin is warm.    Review of Systems  Constitutional: Negative.   HENT: Negative.   Eyes: Negative.   Respiratory: Negative.   Cardiovascular: Negative.   Gastrointestinal: Negative.   Genitourinary: Negative.   Musculoskeletal: Negative.   Skin: Negative.   Neurological: Negative.   Endo/Heme/Allergies: Negative.   Psychiatric/Behavioral: Positive for depression. Negative for hallucinations and suicidal ideas. The patient is nervous/anxious.     Blood pressure (!) 154/73, pulse (!) 56, temperature 97.8 F (36.6 C), temperature source Oral, resp. rate 18, height 6\' 1"  (1.854 m), weight 81.6 kg (180 lb), SpO2 99 %.Body mass index is 23.75 kg/m.  General Appearance: Casual  Eye Contact:  Good  Speech:  Clear and Coherent and Normal Rate  Volume:  Normal  Mood:  Depressed  Affect:  Depressed and Flat  Thought Process:  Goal Directed and Descriptions of  Associations: Intact  Orientation:  Full (Time, Place, and Person)  Thought Content:  WDL  Suicidal Thoughts:  No  Homicidal Thoughts:  No  Memory:  Immediate;   Good Recent;   Good Remote;   Good  Judgement:  Good  Insight:  Good  Psychomotor Activity:  Normal  Concentration:  Concentration: Good and Attention Span: Good  Recall:  Good  Fund of Knowledge:  Good  Language:  Good  Akathisia:  No  Handed:  Right  AIMS (if indicated):     Assets:  Communication Skills Desire for Improvement Financial Resources/Insurance Housing Physical Health Social Support Transportation  ADL's:  Intact  Cognition:  WNL  Sleep:  Number of Hours: 0   Problems Addressed: MDD severe Opioid dependence  Treatment Plan Summary: Daily contact with patient to assess and evaluate symptoms and progress in treatment, Medication management and Plan is to:  -Continue Clonidine Detox Protocol -Continue Lexapro 20 mg PO Daily for mood control -Continue Seroquel 100 mg PO QHS for mood stability -Continue Vistaril 50 mg PO Q6H PRN for anxiety -Encourage group therapy participation  Maryfrances Bunnell, FNP 08/18/2017, 10:56 AM   Agree with NP Progress Note

## 2017-08-18 NOTE — [facility] Group Notes (Signed)
[facility] Group Notes: (Clinical Social Work)   08/18/2017      Type of Therapy:  Group Therapy   Participation Level:  Did Not Attend despite MHT prompting   Ferol Laiche Grossman-Orr, LCSW 08/18/2017, 12:57 PM     

## 2017-08-19 MED ORDER — MENTHOL 3 MG MT LOZG: 1.0000 | LOZENGE | OROMUCOSAL | Status: DC | PRN

## 2017-08-19 MED ORDER — IBUPROFEN 400 MG PO TABS
ORAL_TABLET | ORAL | Status: AC
  Filled 2017-08-19: qty 1

## 2017-08-19 MED ORDER — IBUPROFEN 400 MG PO TABS
400.0000 mg | ORAL_TABLET | Freq: Four times a day (QID) | ORAL | Status: DC | PRN
  Administered 2017-08-19 – 2017-08-20 (×4): 400 mg via ORAL
  Filled 2017-08-19 (×3): qty 1

## 2017-08-19 NOTE — Progress Notes (Addendum)
Patient ID: Edward Little, male   DOB: Jun 08, 1961, 57 y.o.   MRN: [id number]  DAR: Patient reports this morning that black "stuff" is flying onto his bed and his roommate's bed. He points to multiple places on his bed but nothing is seen by Clinical research associatewriter or by [employer]. Patient reports, "it'll get worse later and become long strands of hair." Patient's roommate denies that anything is on his own bed. Writer notified MD Cobos and NP Money. Patient's COWS this morning is a 3. Patient denies A/V hallucinations although at this time it appears that patient may be experiencing some visual hallucinations. He denies SI and HI as well. He reports that his sleep last night was poor, his appetite is fair, his energy level is low, and his concentration is good. He rates his depression level 4/10, his hopelessness 8/10, and his anxiety 7/10. He reports some tremors, sedation, cravings, and runny nose as part of his withdrawal symptoms. He reports some lightheadedness, dizziness, and blurred vision. Due to this and his pulse was low this morning, writer did not administer scheduled Metoprolol. All other scheduled medications are administered to patient per physician's orders. He appears disheveled and presents with poor hygiene. He is seen in the milieu minimally this morning and is minimal with staff. Support and encouragement are provided. Q15 minute safety checks are maintained.

## 2017-08-19 NOTE — Progress Notes (Signed)
Patient ID: Edward Little, male   DOB: October 09, 1960, 57 y.o.   MRN: [id number]  Writer informed MD Cobos of patient's latest BP 168/81. No new orders received.

## 2017-08-19 NOTE — Progress Notes (Signed)
[facility] MD Progress Note  08/19/2017 6:25 PM Rilee Wendling  MRN:  [id number]   Subjective: patient reports he is feeling better. He states he feels that his withdrawal symptoms have abated , and he feels that his mood is improving, although he still feels depressed . Denies suicidal ideations . Denies medication side effects, although reports vivid dreams since he has been in hospital.  Objective:  I have reviewed chart notes, and have met with patient. Patient is a 57 year old male with history of Opiate Dependence, Depression. At this time reports, as above, that he is feeling better, less depressed , and denies any suicidal ideations. He is future oriented and states he is interested in going to rehab setting at discharge. Of note, staff reports patient had reported seeing " black stuff " falling onto his bed today. He had reported " seeing shadows" prior to admission as well.  I reviewed this with patient. Patient states he earlier felt " there was like dust falling from the vent onto my bed", but he is not seeing it now. He states, as above, that he has been having vivid dreams and that upon waking up " it's as though for a second I still see the people I was dreaming of standing there ". Currently does not appear internally preoccupied or bizarre and does not endorse current hallucinations. No thought disorder is noted . Behavior on unit in good control, polite, cooperative on approach.   Principal Problem: MDD (major depressive disorder), severe ([place]) Diagnosis:   Patient Active Problem List   Diagnosis Date Noted  . Opioid use disorder, severe, dependence ([place]) [F11.20] 08/17/2017  . MDD (major depressive disorder), severe ([place]) [F32.2] 08/16/2017   Total Time spent with patient: 20 minutes  Past Psychiatric History: See H&P  Past Medical History:  Past Medical History:  Diagnosis Date  . AAA (abdominal aortic aneurysm) ([place])   . Cellulitis     Past Surgical History:  Procedure  Laterality Date  . ABDOMINAL AORTIC ANEURYSM REPAIR W/ ENDOLUMINAL GRAFT    . revision of AAA graft     Family History: History reviewed. No pertinent family history. Family Psychiatric  History: See H&P Social History:  Social History   Substance and Sexual Activity  Alcohol Use No     Social History   Substance and Sexual Activity  Drug Use Yes   Comment: heroine    Social History   Socioeconomic History  . Marital status: Legally Separated    Spouse name: None  . Number of children: None  . Years of education: None  . Highest education level: None  Social Needs  . Financial resource strain: None  . Food insecurity - worry: None  . Food insecurity - inability: None  . Transportation needs - medical: None  . Transportation needs - non-medical: None  Occupational History  . None  Tobacco Use  . Smoking status: Current Every Day Smoker    Packs/day: 1.00    Types: Cigarettes  . Smokeless tobacco: Current User  Substance and Sexual Activity  . Alcohol use: No  . Drug use: Yes    Comment: heroine  . Sexual activity: None  Other Topics Concern  . None  Social History Narrative  . None   Additional Social History:   Sleep: Poor  Appetite:  improving   Current Medications: Current Facility-Administered Medications  Medication Dose Route Frequency Provider Last Rate Last Dose  . albuterol (PROVENTIL HFA;VENTOLIN HFA) 108 (90 Base) MCG/ACT inhaler  2 puff  2 puff Inhalation Q4H PRN Rankin, Shuvon B, NP      . alum & mag hydroxide-simeth (MAALOX/MYLANTA) 200-200-20 MG/5ML suspension 30 mL  30 mL Oral Q4H PRN Rankin, Shuvon B, NP      . aspirin chewable tablet 81 mg  81 mg Oral Daily Rankin, Shuvon B, NP   81 mg at 08/19/17 0906  . cloNIDine (CATAPRES) tablet 0.1 mg  0.1 mg Oral BH-qamhs Rozetta Nunnery, NP       Followed by  . [START ON 08/22/2017] cloNIDine (CATAPRES) tablet 0.1 mg  0.1 mg Oral QAC breakfast Lindon Romp A, NP      . cyclobenzaprine (FLEXERIL)  tablet 10 mg  10 mg Oral BID PRN Rankin, Shuvon B, NP   10 mg at 08/19/17 0344  . dicyclomine (BENTYL) tablet 20 mg  20 mg Oral Q6H PRN Rankin, Shuvon B, NP   20 mg at 08/19/17 1717  . escitalopram (LEXAPRO) tablet 20 mg  20 mg Oral Daily Rankin, Shuvon B, NP   20 mg at 08/19/17 0905  . hydrOXYzine (ATARAX/VISTARIL) tablet 50 mg  50 mg Oral Q6H PRN Pennelope Bracken, MD   50 mg at 08/19/17 0344  . ibuprofen (ADVIL,MOTRIN) tablet 400 mg  400 mg Oral Q6H PRN Lindon Romp A, NP   400 mg at 08/19/17 0908  . loperamide (IMODIUM) capsule 2-4 mg  2-4 mg Oral PRN Lindon Romp A, NP      . magnesium hydroxide (MILK OF MAGNESIA) suspension 30 mL  30 mL Oral Daily PRN Rankin, Shuvon B, NP      . menthol-cetylpyridinium (CEPACOL) lozenge 3 mg  1 lozenge Oral PRN Lindon Romp A, NP      . metoprolol tartrate (LOPRESSOR) tablet 25 mg  25 mg Oral BID Rankin, Shuvon B, NP   25 mg at 08/19/17 1715  . nicotine (NICODERM CQ - dosed in mg/24 hours) patch 21 mg  21 mg Transdermal Daily Rankin, Shuvon B, NP   21 mg at 08/19/17 0906  . ondansetron (ZOFRAN-ODT) disintegrating tablet 4 mg  4 mg Oral Q6H PRN Lindon Romp A, NP      . QUEtiapine (SEROQUEL) tablet 100 mg  100 mg Oral QHS Lindon Romp A, NP   100 mg at 08/18/17 2155  . tamsulosin (FLOMAX) capsule 0.4 mg  0.4 mg Oral Daily Rankin, Shuvon B, NP   0.4 mg at 08/19/17 2725    Lab Results:  Results for orders placed or performed during the hospital encounter of 08/16/17 (from the past 48 hour(s))  Rapid urine drug screen (hospital performed)     Status: Abnormal   Collection Time: 08/18/17  3:01 AM  Result Value Ref Range   Opiates NONE DETECTED NONE DETECTED   Cocaine NONE DETECTED NONE DETECTED   Benzodiazepines NONE DETECTED NONE DETECTED   Amphetamines POSITIVE (A) NONE DETECTED   Tetrahydrocannabinol NONE DETECTED NONE DETECTED   Barbiturates NONE DETECTED NONE DETECTED    Comment: (NOTE) DRUG SCREEN FOR MEDICAL PURPOSES ONLY.  IF CONFIRMATION  IS NEEDED FOR ANY PURPOSE, NOTIFY LAB WITHIN 5 DAYS. LOWEST DETECTABLE LIMITS FOR URINE DRUG SCREEN Drug Class                     Cutoff (ng/mL) Amphetamine and metabolites    1000 Barbiturate and metabolites    200 Benzodiazepine                 366 Tricyclics and metabolites  300 Opiates and metabolites        300 Cocaine and metabolites        300 THC                            50 Performed at [facility] [address], [place], [place] [address]     Blood Alcohol level:  Lab Results  Component Value Date   ETH <10 08/16/2017   ETH <10 26/20/3559    Metabolic Disorder Labs: No results found for: HGBA1C, MPG No results found for: PROLACTIN No results found for: CHOL, TRIG, HDL, CHOLHDL, VLDL, LDLCALC  Physical Findings: AIMS: Facial and Oral Movements Muscles of Facial Expression: None, normal Lips and Perioral Area: None, normal Jaw: None, normal Tongue: None, normal,Extremity Movements Upper (arms, wrists, hands, fingers): None, normal Lower (legs, knees, ankles, toes): None, normal, Trunk Movements Neck, shoulders, hips: None, normal, Overall Severity Severity of abnormal movements (highest score from questions above): None, normal Incapacitation due to abnormal movements: None, normal Patient's awareness of abnormal movements (rate only patient's report): No Awareness, Dental Status Current problems with teeth and/or dentures?: No Does patient usually wear dentures?: No  CIWA:    COWS:  COWS Total Score: 3  Musculoskeletal: Strength & Muscle Tone: within normal limits no restlessness or acute distress Gait & Station: normal Patient leans: N/A  Psychiatric Specialty Exam: Physical Exam  Nursing note and vitals reviewed. Constitutional: He is oriented to person, place, and time. He appears well-developed and well-nourished.  Respiratory: Effort normal.  Musculoskeletal: Normal range of motion.  Neurological: He is alert and  oriented to person, place, and time.  Skin: Skin is warm.    Review of Systems  Constitutional: Negative.   HENT: Negative.   Eyes: Negative.   Respiratory: Negative.   Cardiovascular: Negative.   Gastrointestinal: Negative.   Genitourinary: Negative.   Musculoskeletal: Negative.   Skin: Negative.   Neurological: Negative.   Endo/Heme/Allergies: Negative.   Psychiatric/Behavioral: Positive for depression. Negative for hallucinations and suicidal ideas. The patient is nervous/anxious.   denies chest pain, no shortness of breath   Blood pressure (!) 161/84, pulse 60, temperature 97.6 F (36.4 C), temperature source Oral, resp. rate 14, height '6\' 1"'  (1.854 m), weight 81.6 kg (180 lb), SpO2 99 %.Body mass index is 23.75 kg/m.  General Appearance: improving grooming   Eye Contact:  Good  Speech:  Normal Rate  Volume:  Normal  Mood:  reports his mood is improving  Affect:  more reactive, smiles at times appropriately   Thought Process:  Linear and Descriptions of Associations: Intact  Orientation:  Other:  fully alert and attentive   Thought Content:  as above, describes brief visual disturbances, hallucinations, but currently not internally preoccupied, no delusions expressed   Suicidal Thoughts:  No denies suicidal or self injurious ideations, no homicidal or violent ideations, contracts for safety on unit   Homicidal Thoughts:  No  Memory:  recent and remote grossly intact   Judgement:  Other:  fair- improving   Insight:  Fair- improving   Psychomotor Activity:  Normal no tremors, no diaphoresis  Concentration:  Concentration: Good and Attention Span: Good  Recall:  Good  Fund of Knowledge:  Good  Language:  Good  Akathisia:  No  Handed:  Right  AIMS (if indicated):     Assets:  Communication Skills Desire for Improvement Financial Resources/Insurance Housing Physical Health Social Support Transportation  ADL's:  Intact  Cognition:  WNL  Sleep:  Number of Hours: 1    Assessment - patient reports improving symptoms and currently does not endorse significant opiate WDL symptoms. He describes improving mood and affect , and currently denies SI. He has reported brief visual disturbances such as seeing dust falling on his bed and fleeting visions of human figures upon waking up. Currently his sensorium is clear and presents fully alert, attentive.  Thus far tolerating medications well .  Treatment Plan Summary: Treatment Plan reviewed as below today 1/27  Daily contact with patient to assess and evaluate symptoms and progress in treatment, Medication management and Plan is to:  -Continue to encourage efforts to work on sobriety and relapse prevention efforts  -Continue Clonidine Detox Protocol to minimize symptoms of WDL  -Continue Lexapro 20 mg PO Daily depression, anxiety  -Continue Seroquel 100 mg PO QHS for mood disorder, psychosis -Continue Vistaril 50 mg PO Q6H PRN for anxiety -Encourage group therapy participation to work on Radiographer, therapeutic and symptom reduction -Treatment team working on disposition planning , patient planning to go to a rehab setting at discharge  Jenne Campus, MD 08/19/2017, 6:25 PM   Patient ID: Hurshel Keys, male   DOB: 09-14-1960, 57 y.o.   MRN: [id number]

## 2017-08-19 NOTE — Progress Notes (Signed)
D: Pt asked the mht to inform the writer that he's been coughing and wanted cough syrup. During the assessment pt was fidgety, and somewhat anxious, but flat. When asked about his day pt stated, "going pretty good". Informed the writer that he's leaving Tues morning, and [facility]. Stated, "I'm not sure, but I gotta go check it out first".  Pt has no other questions or concerns.    A:  Support and encouragement was offered. 15 min checks continued for safety.  R: Pt remains safe.

## 2017-08-19 NOTE — [facility] Group Notes (Signed)
[facility] LCSW Group Therapy Note  Date/Time:  08/19/2017  10:00-11:00AM  Type of Therapy and Topic:  Group Therapy:  Music and Mood  Participation Level:  Did Not Attend   Description of Group: In this process group, members listened to a variety of genres of music and identified that different types of music evoke different responses.  Patients were encouraged to identify music that was soothing for them and music that was energizing for them.  Patients discussed how this knowledge can help with wellness and recovery in various ways including managing depression and anxiety as well as encouraging healthy sleep habits.    Therapeutic Goals: 1. Patients will explore the impact of different varieties of music on mood 2. Patients will verbalize the thoughts they have when listening to different types of music 3. Patients will identify music that is soothing to them as well as music that is energizing to them 4. Patients will discuss how to use this knowledge to assist in maintaining wellness and recovery 5. Patients will explore the use of music as a coping skill  Summary of Patient Progress:  N/A  Therapeutic Modalities: Solution Focused Brief Therapy Activity   Dez Stauffer Grossman-Orr, LCSW     

## 2017-08-20 MED ORDER — QUETIAPINE FUMARATE 100 MG PO TABS: 100.0000 mg | ORAL_TABLET | Freq: Every day | ORAL | 0 refills | Status: AC

## 2017-08-20 MED ORDER — TAMSULOSIN HCL 0.4 MG PO CAPS: 0.4000 mg | ORAL_CAPSULE | Freq: Every day | ORAL | 0 refills | Status: AC

## 2017-08-20 MED ORDER — HYDROXYZINE HCL 50 MG PO TABS: 50.0000 mg | ORAL_TABLET | Freq: Four times a day (QID) | ORAL | 0 refills | Status: AC | PRN

## 2017-08-20 MED ORDER — NICOTINE 21 MG/24HR TD PT24: 21.0000 mg | MEDICATED_PATCH | Freq: Every day | TRANSDERMAL | 0 refills | Status: DC

## 2017-08-20 MED ORDER — ASPIRIN 81 MG PO CHEW: 81.0000 mg | CHEWABLE_TABLET | Freq: Every day | ORAL | 0 refills | Status: AC

## 2017-08-20 MED ORDER — ALBUTEROL SULFATE HFA 108 (90 BASE) MCG/ACT IN AERS: 2.0000 | INHALATION_SPRAY | RESPIRATORY_TRACT | 3 refills | Status: DC | PRN

## 2017-08-20 MED ORDER — ONDANSETRON 4 MG PO TBDP: 4.0000 mg | ORAL_TABLET | Freq: Four times a day (QID) | ORAL | 0 refills | Status: DC | PRN

## 2017-08-20 MED ORDER — CYCLOBENZAPRINE HCL 10 MG PO TABS: 10.0000 mg | ORAL_TABLET | Freq: Two times a day (BID) | ORAL | 0 refills | Status: DC | PRN

## 2017-08-20 MED ORDER — METOPROLOL TARTRATE 25 MG PO TABS: 25.0000 mg | ORAL_TABLET | Freq: Two times a day (BID) | ORAL | 0 refills | Status: DC

## 2017-08-20 MED ORDER — ESCITALOPRAM OXALATE 20 MG PO TABS: 20.0000 mg | ORAL_TABLET | Freq: Every day | ORAL | 0 refills | Status: AC

## 2017-08-20 NOTE — [facility] Suicide Risk Assessment (Signed)
[facility]   Principal Problem: MDD (major depressive disorder), severe [facility](HCC) Discharge Diagnoses:  Patient Active Problem List   Diagnosis Date Noted  . Opioid use disorder, severe, dependence (HCC) [F11.20] 08/17/2017  . MDD (major depressive disorder), severe (HCC) [F32.2] 08/16/2017    Total Time spent with patient: 30 minutes  Musculoskeletal: Strength & Muscle Tone: within normal limits Gait & Station: normal Patient leans: N/A  Psychiatric Specialty Exam: Review of Systems  Constitutional: Negative for chills and fever.  Respiratory: Negative for cough.   Cardiovascular: Negative for chest pain.  Gastrointestinal: Negative for abdominal pain, heartburn, nausea and vomiting.  Psychiatric/Behavioral: Negative for depression, hallucinations and suicidal ideas. The patient is not nervous/anxious.     Blood pressure (!) 145/69, pulse (!) 58, temperature (!) 97.4 F (36.3 C), temperature source Oral, resp. rate 16, height 6\' 1"  (1.854 m), weight 81.6 kg (180 lb), SpO2 99 %.Body mass index is 23.75 kg/m.  General Appearance: Casual and Fairly Groomed  Patent attorneyye Contact::  Good  Speech:  Clear and Coherent and Normal Rate  Volume:  Normal  Mood:  Euthymic  Affect:  Appropriate and Congruent  Thought Process:  Coherent and Goal Directed  Orientation:  Full (Time, Place, and Person)  Thought Content:  Logical  Suicidal Thoughts:  No  Homicidal Thoughts:  No  Memory:  Immediate;   Fair Recent;   Fair Remote;   Fair  Judgement:  Intact  Insight:  Fair  Psychomotor Activity:  Normal  Concentration:  Fair  Recall:  FiservFair  Fund of Knowledge:Fair  Language: Fair  Akathisia:  No  Handed:    AIMS (if indicated):     Assets:  Manufacturing systems engineerCommunication Skills Physical Health Resilience Social Support  Sleep:  Number of Hours: 0  Cognition: WNL  ADL's:  Intact   Mental Status Per Nursing Assessment::   On Admission:  Suicidal ideation indicated by  patient  Demographic Factors:  Male, Caucasian, Low socioeconomic status and Unemployed  Loss Factors: Financial problems/change in socioeconomic status  Historical Factors: Family history of mental illness or substance abuse and Impulsivity  Risk Reduction Factors:   Positive social support, Positive therapeutic relationship and Positive coping skills or problem solving skills  Continued Clinical Symptoms:  Depression:   Comorbid alcohol abuse/dependence Alcohol/Substance Abuse/Dependencies  Cognitive Features That Contribute To Risk:  None    Suicide Risk:  Minimal: No identifiable suicidal ideation.  Patients presenting with no risk factors but with morbid ruminations; may be classified as minimal risk based on the severity of the depressive symptoms  Follow-up Information    [facility] Follow up.   Specialty:  Behavioral Health Why:  Please walk in within 3 days of hospital/rehab discharge for mental health assessment. Walk in hours: Mon-Fri 8am-9am. Thank you.  Contact information: [address] [place] [address] [phone]        Services[facility] Follow up on 08/21/2017.   Why:  Screening for possible admission on Tuesday 08/21/17 at 8:00AM. Please bring: photo ID/proof of [place] residency, medicare card, medications/prescriptions provided by hospital, and clothing. Thank you.  Contact information: [place] [address] [place] [address] [phone]         Subjective Data:  Edward JoLarry Little is a 57 y/o M with history of MDD and opioid use disorder who [facility] with worsening depression, SI without plan, AH, VH, and worsening substance use of heroin. Pt was started on previous medications of  lexapro and seroquel, and he was monitored on the opiate withdrawal protocol. He was accepted into [facility] for substance use treatment on 08/21/17.  Today upon evaluation, pt shares, "I'm feeling a little bit better in  terms of my mood." He denies SI/HI/AH/VH. He is sleeping adequately and his appetite is fair. He is tolerating his medications without difficulty. He feels that most of the physical symptoms of opioid withdrawal have subsided, and he still in agreement and motivated to discharge to [facility] in the AM. He asks about stopping by his home to pick up clothes after DC, and we discussed that he will not be able to stop, so he should look into the clothing donations here or at [facility], and pt was in agreement with that plan. He agrees to continue his current treatment regimen without changes. He was able to engage in safety planning including plan to return to [facility] or contact emergency services if he feels unable to maintain his own safety or the safety of others. Pt had no further questions, comments, or concerns.   Plan Of Care/Follow-up recommendations:   - Discharge to outpatient level of care (DC directly to [facility] in AM)  -MDD             - Continue lexapro 20mg  po qDay             - Continue seroquel 100mg  po qhs  -HTN   - Continue lopressor 25mg  BID  - Anxiety             - Continue atarax 50mg  po q6h prn anxiety  -BPH             - Continue flomax 0.4mg  qDay   Activity:  as tolerated Diet:  normal Tests:  NA Other:  see above for DC plan  Micheal Likens, MD 08/20/2017, 3:51 PM

## 2017-08-20 NOTE — Progress Notes (Signed)
Patient ID: Edward Little, male   DOB: 01-17-1961, 57 y.o.   MRN: [id number] PER STATE REGULATIONS 482.30  THIS CHART WAS REVIEWED FOR MEDICAL NECESSITY WITH RESPECT TO THE PATIENT'S ADMISSION/ DURATION OF STAY.  NEXT REVIEW DATE: 08/24/2017 Willa RoughJENNIFER JONES Edward Dia, RN, BSN CASE MANAGER

## 2017-08-20 NOTE — Plan of Care (Signed)
  Progressing Self-Concept: Ability to disclose and discuss suicidal ideas will improve 08/20/2017 1425 - Progressing by Lenord Fellersopson, [place] Athanas Elizabeth, RN

## 2017-08-20 NOTE — Progress Notes (Signed)
D: Pt was in his room upon initial approach.  Pt presents with depressed affect and mood.  He describes his day as "okay" and reports goal is to "try to get through some of this depression, feel better."  He reports he feels better today than he did yesterday.  Pt denies SI/HI, denies hallucinations, reports bilateral leg pain of 6/10.  He [facility] and he feels safe with this plan.  Pt has been visible in milieu interacting with peers and staff appropriately.  Pt attended evening group.    A: Introduced self to pt.  Actively listened to pt and offered support and encouragement. Medications administered per order.  PRN medication administered for pain.  Q15 minute safety checks maintained.  R: Pt is safe on the unit.  Pt is compliant with medications.  Pt verbally contracts for safety.  Will continue to monitor and assess.

## 2017-08-20 NOTE — [facility] Group Notes (Signed)
[facility] [employer] Group Therapy 08/20/2017 1:15pm  Type of Therapy: Mental Health Association Presentation  Participation Level: Active  Participation Quality: Attentive  Affect: Appropriate  Cognitive: Oriented  Insight: Developing/Improving  Engagement in Therapy: Engaged  Modes of Intervention: Discussion, Education and Socialization  Summary of Progress/Problems: Mental Health Association (MHA) Speaker came to talk about his personal journey with mental health. The pt processed ways by which to relate to the speaker. MHA speaker provided handouts and educational information pertaining to groups and services offered by the MHA. Pt was engaged in speaker's presentation and was receptive to resources provided.    Torii Royse N Smart, LCSW 08/20/2017 2:02 PM  

## 2017-08-20 NOTE — Progress Notes (Signed)
Patient ID: Edward Little, male   DOB: 11/25/1960, 57 y.o.   MRN: [id number]  DAR: Pt. Denies SI/HI and A/V Hallucinations. He reports that his sleep last night was fair, his appetite is fair, his energy level is low, and his concentration is good. He rates his depression level 5/10, his hopelessness level 5/10, and his anxiety level 6/10. Patient reports some abdominal cramping and discomfort this morning but refuses intervention. He continues to report withdrawal symptoms including cravings, agitation, and irritability. He is visibly tremulous as well. He reports today is not a good day for him physically. Support and encouragement provided to the patient. Scheduled medications administered to patient except his evening Metoprolol. This was not given due to patient's pulse rate of 54. Patient is minimal and isolative to his room throughout most of the day. He does not appear to be interacting much with his peers. Q15 minute checks are maintained for safety.

## 2017-08-20 NOTE — Progress Notes (Signed)
Patient attended AA group meeting tonight.  

## 2017-08-20 NOTE — Discharge Summary (Signed)
Physician Discharge Summary Note  Patient:  Edward Little is an 57 y.o., male  MRN:  [id number]  DOB:  1961/01/19  Patient phone:  [phone] (home)   Patient address:   [address] [place] [address],   Total Time spent with patient: Greater than 30 minutes  Date of Admission:  08/16/2017  Date of Discharge: 08-20-17  Reason for Admission: Increased drug use, worsening depression/psychosis with suicidal ideations with plans  Principal Problem: MDD (major depressive disorder), severe [facility])  Discharge Diagnoses: Patient Active Problem List   Diagnosis Date Noted  . Opioid use disorder, severe, dependence (HCC) [F11.20] 08/17/2017  . MDD (major depressive disorder), severe (HCC) [F32.2] 08/16/2017   Past Psychiatric History: Opioid use disorder, MDD.  Past Medical History:  Past Medical History:  Diagnosis Date  . AAA (abdominal aortic aneurysm) (HCC)   . Cellulitis     Past Surgical History:  Procedure Laterality Date  . ABDOMINAL AORTIC ANEURYSM REPAIR W/ ENDOLUMINAL GRAFT    . revision of AAA graft     Family History: History reviewed. No pertinent family history.  Family Psychiatric  History: See H&P.  Social History:  Social History   Substance and Sexual Activity  Alcohol Use No     Social History   Substance and Sexual Activity  Drug Use Yes   Comment: heroine    Social History   Socioeconomic History  . Marital status: Legally Separated    Spouse name: None  . Number of children: None  . Years of education: None  . Highest education level: None  Social Needs  . Financial resource strain: None  . Food insecurity - worry: None  . Food insecurity - inability: None  . Transportation needs - medical: None  . Transportation needs - non-medical: None  Occupational History  . None  Tobacco Use  . Smoking status: Current Every Day Smoker    Packs/day: 1.00    Types: Cigarettes  . Smokeless tobacco: Current User  Substance and Sexual  Activity  . Alcohol use: No  . Drug use: Yes    Comment: heroine  . Sexual activity: None  Other Topics Concern  . None  Social History Narrative  . None   Hospital Course: (per admission assessment): Edward Little is a 57 y/o M with history of MDD and opioid use disorder who was admitted voluntarily from [facility] ED with worsening depression, SI without plan, AH, VH, and worsening substance use of heroin.  Pt has no previous psychiatric admissions. Upon arrival to [facility], pt denied AH/VH, but he continued to endorse severe anxiety, depression, and SI without plan. Upon interview, pt shares,"I've been feeling a depression, and it keeps getting worse and worse." He continues, "I can't deal with it any more; I feel full of despair." Pt endorses SI without specific plan, which is what prompted him to seek help. He denies HI/AH/VH. He reports he has been sleeping poorly. He endorses depressive symptoms of anhedonia, guilty feelings, low energy, poor concentration, poor appetite, decreased motivation, hopelessness, and decreased motivation. He denies symptoms of mania, OCD, and PTSD. He enordses use of $50-100 daily of heroin and he reports having an "off and on" pattern of usage over the past 6-7 years.  Edward Little was admitted to the hospital for worsening symptoms of depression & crisis management due to severe suicidal thoughts, auditory hallucinations triggered by increased substance use. Reports indicated he has no prior psychiatric history. He was recommended after admission assessment for mood stabilization &  a referral to a substance abuse treatment center to continue substance abuse treatment.   After his admission assessment, the medication regimen for his presenting symptoms were discussed & initiated. He received a brief Clonidine detoxification regimen for opioid withdrawal symptoms. Edward Little also received & was discharged on; Lexapro 20 mg for depression, Hydroxyzine 50 mg prn for anxiety, Nicotine  patch 21 mg for smoking cessation & Seroquel 100 mg for mood control. His other pre-existing medical problems were identified & treated by resuming his pertinent home medications for health issues. He tolerated his treatment regimen without any adverse effects or reactions reported. He was enrolled & participated in the group counseling sessions being offered & held on this unit. He learned coping skills..  During the course of his hospitalization, Edward Little's improvement was monitored by observation & his daily report of symptom reduction noted. His emotional and mental status were also monitored by the daily self-inventory reports completed by him & the clinical staff. He was evaluated daily by the treatment team for mood stability and plans for continued recovery after discharge.  He was offered further treatment options upon discharge will go to the [facility] treatment center to continue substance abuse treatment. He was provided with all the necessary information needed to make this appointment without problems.   Upon discharge, Edward Little was both mentally and medically stable. He denies suicidal/homicidal ideations, auditory/visual/tactile hallucinations, delusional thoughts or paranoia. He received from the pharmacy, a 14 days worth, supply samples of his [facility] discharge medications. He left [facility] with all personal belongings in no apparent distress. Transportation per taxi. [facility] assisted with taxi fare.  Physical Findings:  AIMS: Facial and Oral Movements Muscles of Facial Expression: None, normal Lips and Perioral Area: None, normal Jaw: None, normal Tongue: None, normal,Extremity Movements Upper (arms, wrists, hands, fingers): None, normal Lower (legs, knees, ankles, toes): None, normal, Trunk Movements Neck, shoulders, hips: None, normal, Overall Severity Severity of abnormal movements (highest score from questions above): None, normal Incapacitation due to abnormal movements: None,  normal Patient's awareness of abnormal movements (rate only patient's report): No Awareness, Dental Status Current problems with teeth and/or dentures?: No Does patient usually wear dentures?: No  CIWA:    COWS:  COWS Total Score: 5  Musculoskeletal: Strength & Muscle Tone: within normal limits Gait & Station: normal Patient leans: N/A  Psychiatric Specialty Exam: Physical Exam  Constitutional: He appears well-developed.  HENT:  Head: Normocephalic.  Eyes: Pupils are equal, round, and reactive to light.  Neck: Normal range of motion.  Cardiovascular: Normal rate.  Respiratory: Effort normal.  GI: Soft.  Genitourinary:  Genitourinary Comments: Deferred  Musculoskeletal: Normal range of motion.  Neurological: He is alert.  Skin: Skin is warm.    Review of Systems  Constitutional: Negative.   HENT: Negative.   Eyes: Negative.   Respiratory: Negative.   Cardiovascular: Negative.   Gastrointestinal: Negative.   Genitourinary: Negative.   Musculoskeletal: Negative.   Skin: Negative.   Neurological: Negative.   Endo/Heme/Allergies: Negative.   Psychiatric/Behavioral: Positive for depression (Stable), hallucinations and substance abuse. Negative for memory loss and suicidal ideas. The patient has insomnia (Stable). The patient is not nervous/anxious.     Blood pressure (!) 145/69, pulse (!) 58, temperature (!) 97.4 F (36.3 C), temperature source Oral, resp. rate 16, height 6\' 1"  (1.854 m), weight 81.6 kg (180 lb), SpO2 99 %.Body mass index is 23.75 kg/m.  See Md's SRA   Have you used any form of tobacco in the last 30  days? (Cigarettes, Smokeless Tobacco, Cigars, and/or Pipes): Yes  Has this patient used any form of tobacco in the last 30 days? (Cigarettes, Smokeless Tobacco, Cigars, and/or Pipes): Yes, a FDA-approved tobacco cessation medication was offered at discharge.  Blood Alcohol level:  Lab Results  Component Value Date   ETH <10 08/16/2017   ETH <10 06/08/2017    Metabolic Disorder Labs:  No results found for: HGBA1C, MPG No results found for: PROLACTIN No results found for: CHOL, TRIG, HDL, CHOLHDL, VLDL, LDLCALC  See Psychiatric Specialty Exam and Suicide Risk Assessment completed by Attending Physician prior to discharge.  Discharge destination:  Home  Is patient on multiple antipsychotic therapies at discharge:  No   Has Patient had three or more failed trials of antipsychotic monotherapy by history:  No  Recommended Plan for Multiple Antipsychotic Therapies: NA  Allergies as of 08/20/2017      Reactions   Iodinated Diagnostic Agents Hives   Ketorolac Tromethamine Diarrhea   Stomach cramps   Tape Itching      Medication List    STOP taking these medications   ondansetron 4 MG tablet Commonly known as:  ZOFRAN     TAKE these medications     Indication  albuterol 108 (90 Base) MCG/ACT inhaler Commonly known as:  PROVENTIL HFA;VENTOLIN HFA Inhale 2 puffs into the lungs every 4 (four) hours as needed for wheezing.  Indication:  Asthma   aspirin 81 MG chewable tablet Chew 1 tablet (81 mg total) by mouth daily. For heart health Start taking on:  08/21/2017  Indication:  Heart health   cyclobenzaprine 10 MG tablet Commonly known as:  FLEXERIL Take 1 tablet (10 mg total) by mouth 2 (two) times daily as needed for muscle spasms.  Indication:  Muscle Spasm   escitalopram 20 MG tablet Commonly known as:  LEXAPRO Take 1 tablet (20 mg total) by mouth daily. For depression Start taking on:  08/21/2017 What changed:  additional instructions  Indication:  Major Depressive Disorder   hydrOXYzine 50 MG tablet Commonly known as:  ATARAX/VISTARIL Take 1 tablet (50 mg total) by mouth every 6 (six) hours as needed for anxiety.  Indication:  Feeling Anxious   metoprolol tartrate 25 MG tablet Commonly known as:  LOPRESSOR Take 1 tablet (25 mg total) by mouth 2 (two) times daily. For high blood pressure What changed:  additional  instructions  Indication:  High Blood Pressure Disorder   nicotine 21 mg/24hr patch Commonly known as:  NICODERM CQ - dosed in mg/24 hours Place 1 patch (21 mg total) onto the skin daily. For smoking cessation Start taking on:  08/21/2017  Indication:  Nicotine Addiction   ondansetron 4 MG disintegrating tablet Commonly known as:  ZOFRAN-ODT Take 1 tablet (4 mg total) by mouth every 6 (six) hours as needed for nausea or vomiting.  Indication:  Nausea and Vomiting   QUEtiapine 100 MG tablet Commonly known as:  SEROQUEL Take 1 tablet (100 mg total) by mouth at bedtime. For mood control What changed:  additional instructions  Indication:  Mood control   tamsulosin 0.4 MG Caps capsule Commonly known as:  FLOMAX Take 1 capsule (0.4 mg total) by mouth daily. For prostate health Start taking on:  08/21/2017  Indication:  Benign Enlargement of Prostate      Follow-up Information    [facility] Follow up.   Specialty:  Behavioral Health Why:  Please walk in within 3 days of hospital/rehab discharge for mental health assessment. Walk in hours: Mon-Fri  8am-9am. Thank you.  Contact information: [address] [place] [address] [phone]        Services, [facility] Follow up on 08/21/2017.   Why:  Screening for possible admission on Tuesday 08/21/17 at 8:00AM. Please bring: photo ID/proof of [place] residency, medicare card, medications/prescriptions provided by hospital, and clothing. Thank you.  Contact information: [address] [place] [address] [phone]          Follow-up recommendations: Activity:  As tolerated Diet: As recommended by your primary care doctor. Keep all scheduled follow-up appointments as recommended.   Comments: Patient is instructed prior to discharge to: Take all medications as prescribed by his/her mental healthcare provider. Report any adverse effects and or reactions from the medicines to his/her outpatient provider  promptly. Patient has been instructed & cautioned: To not engage in alcohol and or illegal drug use while on prescription medicines. In the event of worsening symptoms, patient is instructed to call the crisis hotline, 911 and or go to the nearest ED for appropriate evaluation and treatment of symptoms. To follow-up with his/her primary care provider for your other medical issues, concerns and or health care needs.   Signed: Armandina Stammer, NP, PMHNP, FNP. 08/20/2017, 2:54 PM   Patient seen, Suicide Assessment Completed.  Disposition Plan Reviewed    Edward Little is a 57 y/o M with history of MDD and opioid use disorder who was admitted voluntarily from [facility] ED with worsening depression, SI without plan, AH, VH, and worsening substance use of heroin. Pt was started on previous medications of lexapro and seroquel, and he was monitored on the opiate withdrawal protocol. He was accepted into [facility] for substance use treatment on 08/21/17.  Today upon evaluation, pt shares, "I'm feeling a little bit better in terms of my mood." He denies SI/HI/AH/VH. He is sleeping adequately and his appetite is fair. He is tolerating his medications without difficulty. He feels that most of the physical symptoms of opioid withdrawal have subsided, and he still in agreement and motivated to discharge to [facility] in the AM. He asks about stopping by his home to pick up clothes after DC, and we discussed that he will not be able to stop, so he should look into the clothing donations here or at [facility], and pt was in agreement with that plan. He agrees to continue his current treatment regimen without changes. He was able to engage in safety planning including plan to return to [facility] or contact emergency services if he feels unable to maintain his own safety or the safety of others. Pt had no further questions, comments, or concerns.  Plan Of Care/Follow-up recommendations:   - Discharge to outpatient level of care (DC  directly to [facility] in AM)  -MDD - Continue lexapro 20mg  po qDay - Continue seroquel 100mg  po qhs  -HTN             - Continue lopressor 25mg  BID  - Anxiety - Continue atarax 50mg  po q6h prn anxiety  -BPH - Continue flomax 0.4mg  qDay   Activity:  as tolerated Diet:  normal Tests:  NA Other:  see above for DC plan  Micheal Likens, MD

## 2017-08-20 NOTE — Progress Notes (Signed)
  [facility] :  Will you be returning to the same living situation after discharge:  No.Pt has screening for possible admission on Tuesday [facility].  At discharge, do you have transportation home?: Yes,  taxi voucher in chart. PATIENT MUST DISCHARGE NO LATER THAN 7:15AM ON TUES, 1/29 IN ORDER TO GET TO [facility] SCREENING BY 8AM.  Do you have the ability to pay for your medications: Yes, medicare  Release of information consent forms completed and submitted to medical records by CSW.  Patient to Follow up at: Follow-up Information    [facility].   Specialty:  Behavioral Health Why:  Please walk in within 3 days of hospital/rehab discharge for mental health assessment. Walk in hours: Mon-Fri 8am-9am. Thank you.  Contact information: [address] [place] [address] [phone]        [facility] Follow up on 08/21/2017.   Why:  Screening for possible admission on Tuesday 08/21/17 at 8:00AM. Please bring: photo ID/proof of [place] residency, medicare card, medications/prescriptions provided by hospital, and clothing. Thank you.  Contact information: [address] [place] [address] [phone]           Next level of care provider [facility] Link:no  Safety Planning and Suicide Prevention discussed: Yes,  SPE completed with pt; pt declined to consent to family contact. SPI pamphlet and Mobile Crisis information provided to pt.   Have you used any form of tobacco in the last 30 days? (Cigarettes, Smokeless Tobacco, Cigars, and/or Pipes): Yes  Has patient been referred to the Quitline?: Patient refused referral  Patient has been referred for addiction treatment: Yes  Pulte HomesHeather N Smart, LCSW 08/20/2017, 11:04 AM

## 2017-08-20 NOTE — [facility] Suicide Risk Assessment (Signed)
[facility] INPATIENT:  Family/Significant Other Suicide Prevention Education  Suicide Prevention Education:  Patient Refusal for Family/Significant Other Suicide Prevention Education: The patient Edward Little has refused to provide written consent for family/significant other to be provided Family/Significant Other Suicide Prevention Education during admission and/or prior to discharge.  Physician notified.  SPE completed with pt, as pt refused to consent to family contact. SPI pamphlet provided to pt and pt was encouraged to share information with support network, ask questions, and talk about any concerns relating to SPE. Pt denies access to guns/firearms and verbalized understanding of information provided. Mobile Crisis information also provided to pt.   Edward Little Smart LCSW 08/20/2017, 11:04 AM

## 2017-08-20 NOTE — Progress Notes (Signed)
Recreation Therapy Notes  Date: 08/20/17 Time: 0930 Location: [address] Dayroom  Group Topic: Stress Management  Goal Area(s) Addresses:  Patient will verbalize importance of using healthy stress management.  Patient will identify positive emotions associated with healthy stress management.   Intervention: Stress Management  Activity : Meditation.  LRT played a body scan meditation from the Calm app. The meditation allowed patients to take inventory of any sensations they may or may not have been feeling.  Education:  Stress Management, Discharge Planning.   Education Outcome: Acknowledges edcuation/In group clarification offered/Needs additional education  Clinical Observations/Feedback: Pt did not attend group.     Caroll RancherMarjette Murry Diaz, LRT/CTRS         Caroll RancherLindsay, Shakeel Disney A 08/20/2017 12:04 PM

## 2017-08-21 ENCOUNTER — Telehealth: Payer: Self-pay

## 2017-08-21 NOTE — Progress Notes (Signed)
Pt denies SI/HI, denies hallucinations, denies pain, denies withdrawal symptoms.  He [facility]  All belongings returned to pt from locker.  Appropriate paperwork reviewed with and provided to pt.  Pt verbalizes understanding.  Prescriptions and 2 week supply of medication provided to pt.    [facility]

## 2017-08-21 NOTE — Telephone Encounter (Signed)
Transition Care Management Follow-up Telephone Call   Date discharged? 08/21/2017   How have you been since you were released from the hospital? Patient states he is doing okay.   Do you understand why you were in the hospital? yes   Do you understand the discharge instructions? yes   Where were you discharged to? home   Items Reviewed:  Medications reviewed: yes  Allergies reviewed: yes  Dietary changes reviewed: yes  Referrals reviewed: yes   Functional Questionnaire:   Activities of Daily Living (ADLs):   He states they are independent in the following: ambulation, bathing and hygiene, feeding, continence, grooming, toileting and dressing States they require assistance with the following: none   Any transportation issues/concerns?: no   Any patient concerns? no   Confirmed importance and date/time of follow-up visits scheduled yes  Provider Appointment booked with- Patient stated that he will give us a call back when his schedule frees up sometime next week to schedule a follow up visit.  Confirmed with patient if condition begins to worsen call PCP or go to the ER.  Patient was given the office number and encouraged to call back with question or concerns.  : yes

## 2017-08-30 ENCOUNTER — Other Ambulatory Visit: Payer: Self-pay

## 2017-08-30 ENCOUNTER — Emergency Department (HOSPITAL_BASED_OUTPATIENT_CLINIC_OR_DEPARTMENT_OTHER)
Admission: EM | Admit: 2017-08-30 | Discharge: 2017-08-30 | Disposition: A | Discharge status: Home / Self Care | Payer: Medicare Other | Attending: Emergency Medicine | Admitting: Emergency Medicine

## 2017-08-30 ENCOUNTER — Encounter (HOSPITAL_BASED_OUTPATIENT_CLINIC_OR_DEPARTMENT_OTHER): Payer: Self-pay | Admitting: Emergency Medicine

## 2017-08-30 DIAGNOSIS — R29898 Other symptoms and signs involving the musculoskeletal system: Secondary | ICD-10-CM | POA: Diagnosis not present

## 2017-08-30 DIAGNOSIS — F17210 Nicotine dependence, cigarettes, uncomplicated: Secondary | ICD-10-CM | POA: Diagnosis not present

## 2017-08-30 DIAGNOSIS — R531 Weakness: Secondary | ICD-10-CM | POA: Diagnosis present

## 2017-08-30 DIAGNOSIS — Z79899 Other long term (current) drug therapy: Secondary | ICD-10-CM | POA: Insufficient documentation

## 2017-08-30 DIAGNOSIS — Z7982 Long term (current) use of aspirin: Secondary | ICD-10-CM | POA: Diagnosis not present

## 2017-08-30 MED ORDER — METRONIDAZOLE 500 MG PO TABS: 500.0000 mg | ORAL_TABLET | Freq: Once | ORAL | Status: DC

## 2017-08-30 NOTE — ED Triage Notes (Signed)
Patient states that he has had some numbness and tingling to his bilateral legs and feet x "months" [facility] - reports that 3 months ago he had an epidode where he lost all control of his legs for 4 hours, and now the "episodes" are being more frequently. The [facility] be [facility] to continue the program

## 2017-08-30 NOTE — Discharge Instructions (Signed)
Continue taking your home medications.  Drink plenty of fluids and get plenty of rest.  Follow-up with a vascular surgeon for reevaluation of your symptoms.  Your exhibiting symptoms of vascular claudication.  Return to the emergency department if any concerning signs or symptoms develop such as fever, loss of bowel or bladder control, numbness or tingling to the genital region, swelling/redness of the extremities, or urinary retention.

## 2017-08-30 NOTE — ED Provider Notes (Signed)
[facility] EMERGENCY DEPARTMENT Provider Note   CSN: [id number] Arrival date & time: 08/30/17  1825     History   Chief Complaint Chief Complaint  Patient presents with  . Extremity Weakness    HPI Edward Little is a 57 y.o. male with history of AAA status post repair, MDD, and opioid use disorder presents today with chief complaint gradual onset intermittent bilateral lower extremity weakness and numbness sensation.  He states that he has had vascular issues for several years but over the past 3 months he feels that he has been having more issues with this regarding his lower extremities.  He states that the episodes typically occur when he stands from a seated position and ambulates approximately 20-30 feet.  He states that he will study himself along an object or a wall and the symptoms will progressively improve and then resolved.  He denies fevers, chills, bowel or bladder incontinence, or saddle anesthesia.  Last IV drug use was over 2 weeks ago.  He states that he was told that he needs to follow-up with a vascular surgeon at some point.  No recent travel or surgeries, no hemoptysis, no prior history of DVT or PE.  He will occasionally have burning pain to the right buttock and left inner thigh near the knee.  He states that this is been chronic and unchanged for some time.  The history is provided by the patient.    Past Medical History:  Diagnosis Date  . AAA (abdominal aortic aneurysm) (HCC)   . Cellulitis     Patient Active Problem List   Diagnosis Date Noted  . Opioid use disorder, severe, dependence (HCC) 08/17/2017  . MDD (major depressive disorder), severe (HCC) 08/16/2017    Past Surgical History:  Procedure Laterality Date  . ABDOMINAL AORTIC ANEURYSM REPAIR W/ ENDOLUMINAL GRAFT    . revision of AAA graft         Home Medications    Prior to Admission medications   Medication Sig Start Date End Date Taking? Authorizing Provider  albuterol  (PROVENTIL HFA;VENTOLIN HFA) 108 (90 Base) MCG/ACT inhaler Inhale 2 puffs into the lungs every 4 (four) hours as needed for wheezing. 08/20/17   Armandina Stammer I, NP  aspirin 81 MG chewable tablet Chew 1 tablet (81 mg total) by mouth daily. For heart health 08/21/17   Armandina Stammer I, NP  cyclobenzaprine (FLEXERIL) 10 MG tablet Take 1 tablet (10 mg total) by mouth 2 (two) times daily as needed for muscle spasms. 08/20/17   Armandina Stammer I, NP  escitalopram (LEXAPRO) 20 MG tablet Take 1 tablet (20 mg total) by mouth daily. For depression 08/21/17   Armandina Stammer I, NP  hydrOXYzine (ATARAX/VISTARIL) 50 MG tablet Take 1 tablet (50 mg total) by mouth every 6 (six) hours as needed for anxiety. 08/20/17   Armandina Stammer I, NP  metoprolol tartrate (LOPRESSOR) 25 MG tablet Take 1 tablet (25 mg total) by mouth 2 (two) times daily. For high blood pressure 08/20/17   Nwoko, Nicole Kindred I, NP  nicotine (NICODERM CQ - DOSED IN MG/24 HOURS) 21 mg/24hr patch Place 1 patch (21 mg total) onto the skin daily. For smoking cessation 08/21/17   Armandina Stammer I, NP  ondansetron (ZOFRAN-ODT) 4 MG disintegrating tablet Take 1 tablet (4 mg total) by mouth every 6 (six) hours as needed for nausea or vomiting. 08/20/17   Armandina Stammer I, NP  QUEtiapine (SEROQUEL) 100 MG tablet Take 1 tablet (100 mg total) by mouth  at bedtime. For mood control 08/20/17   Armandina Stammer I, NP  tamsulosin (FLOMAX) 0.4 MG CAPS capsule Take 1 capsule (0.4 mg total) by mouth daily. For prostate health 08/21/17   Sanjuana Kava, NP    Family History History reviewed. No pertinent family history.  Social History Social History   Tobacco Use  . Smoking status: Current Every Day Smoker    Packs/day: 1.00    Types: Cigarettes  . Smokeless tobacco: Current User  Substance Use Topics  . Alcohol use: No  . Drug use: Yes    Comment: heroine     Allergies   Iodinated diagnostic agents; Ketorolac tromethamine; and Tape   Review of Systems Review of Systems    Constitutional: Negative for chills and fever.  Respiratory: Negative for shortness of breath.   Cardiovascular: Negative for chest pain.  Gastrointestinal: Negative for abdominal pain, diarrhea, nausea and vomiting.  Genitourinary:       No bowel/bladder incontinence  Musculoskeletal: Negative for back pain.  Neurological: Positive for weakness and numbness. Negative for tremors, syncope, light-headedness and headaches.  All other systems reviewed and are negative.    Physical Exam Updated Vital Signs BP (!) 165/83 (BP Location: Right Arm)   Pulse 75   Temp 97.8 F (36.6 C) (Oral)   Resp 18   Ht 6\' 1"  (1.854 m)   Wt 81.6 kg (180 lb)   SpO2 100%   BMI 23.75 kg/m   Physical Exam  Constitutional: He is oriented to person, place, and time. He appears well-developed and well-nourished. No distress.  HENT:  Head: Normocephalic and atraumatic.  Eyes: Conjunctivae are normal. Right eye exhibits no discharge. Left eye exhibits no discharge.  Neck: Normal range of motion. Neck supple. No JVD present. No tracheal deviation present.  Cardiovascular: Normal rate, regular rhythm, normal heart sounds and intact distal pulses.  2+ radial, femoral, and DP/PT pulses bilaterally.  Homans sign absent bilaterally.  No calf tenderness or lower extremity edema noted.  No palpable cords.  Compartments are soft.  Extremities are warm equally  Pulmonary/Chest: Effort normal and breath sounds normal.  Abdominal: Soft. Bowel sounds are normal. He exhibits no distension.  Musculoskeletal: Normal range of motion. He exhibits no edema or tenderness.  No midline spine TTP, no paraspinal muscle tenderness, no deformity, crepitus, or step-off noted.  Mild right SI joint tenderness noted.  No erythema, swelling, deformity, crepitus, or tenderness to palpation of the bilateral lower extremities.  5/5 strength of BLE major muscle groups.  Neurological: He is alert and oriented to person, place, and time. No  cranial nerve deficit or sensory deficit. He exhibits normal muscle tone.  Fluent speech, no facial droop, sensation intact to soft touch of extremities, normal gait and balance, and patient able to heel walk and toe walk without difficulty.   Skin: Skin is warm and dry. Capillary refill takes less than 2 seconds. No erythema.  Psychiatric: He has a normal mood and affect. His behavior is normal.  Nursing note and vitals reviewed.    ED Treatments / Results  Labs (all labs ordered are listed, but only abnormal results are displayed) Labs Reviewed - No data to display  EKG  EKG Interpretation None       Radiology No results found.  Procedures Procedures (including critical care time)  Medications Ordered in ED Medications - No data to display   Initial Impression / Assessment and Plan / ED Course  I have reviewed the triage vital signs  and the nursing notes.  Pertinent labs & imaging results that were available during my care of the patient were reviewed by me and considered in my medical decision making (see chart for details).     Patient with known pseudoaneurysms of the bilateral lower extremities presents with symptoms consistent with vascular claudication.  Afebrile, vital signs are stable.  He is nontoxic in appearance.  No focal neurologic deficits on examination.  He has a history of IV drug use but otherwise no red flag signs concerning for cauda equina or spinal abscess and I do not think this is what is causing his symptoms today in the absence of bowel or bladder incontinence or saddle anesthesia.  Doubt DVT in the absence of pain or swelling.  No risk factors for DVT.  He is ambulatory without difficulty on my examination.  He will follow-up with a vascular surgeon after treatment at [facility].  Discussed indications for return to the ED. Pt verbalized understanding of and agreement with plan and is safe for discharge home at this time.  No complaints prior to  discharge.  Final Clinical Impressions(s) / ED Diagnoses   Final diagnoses:  Transient weakness of lower extremity    ED Discharge Orders    None       Bennye AlmFawze, Dravon Nott A, PA-C 08/30/17 2251    Benjiman CorePickering, Nathan, MD 08/31/17 [phone]

## 2017-09-23 ENCOUNTER — Other Ambulatory Visit: Payer: Self-pay

## 2017-09-23 ENCOUNTER — Observation Stay (HOSPITAL_BASED_OUTPATIENT_CLINIC_OR_DEPARTMENT_OTHER): Payer: Medicare Other

## 2017-09-23 ENCOUNTER — Encounter (HOSPITAL_COMMUNITY): Payer: Self-pay | Admitting: Emergency Medicine

## 2017-09-23 ENCOUNTER — Emergency Department (HOSPITAL_COMMUNITY): Payer: Medicare Other

## 2017-09-23 ENCOUNTER — Inpatient Hospital Stay (HOSPITAL_COMMUNITY)
Admission: EM | Admit: 2017-09-23 | Discharge: 2017-09-27 | DRG: 871 | Disposition: A | Discharge status: Home / Self Care | Payer: Medicare Other | Attending: Family Medicine | Admitting: Family Medicine

## 2017-09-23 DIAGNOSIS — R748 Abnormal levels of other serum enzymes: Secondary | ICD-10-CM | POA: Diagnosis not present

## 2017-09-23 DIAGNOSIS — F419 Anxiety disorder, unspecified: Secondary | ICD-10-CM | POA: Diagnosis present

## 2017-09-23 DIAGNOSIS — I252 Old myocardial infarction: Secondary | ICD-10-CM

## 2017-09-23 DIAGNOSIS — K7581 Nonalcoholic steatohepatitis (NASH): Secondary | ICD-10-CM | POA: Diagnosis present

## 2017-09-23 DIAGNOSIS — I739 Peripheral vascular disease, unspecified: Secondary | ICD-10-CM | POA: Diagnosis present

## 2017-09-23 DIAGNOSIS — Z91048 Other nonmedicinal substance allergy status: Secondary | ICD-10-CM

## 2017-09-23 DIAGNOSIS — A419 Sepsis, unspecified organism: Principal | ICD-10-CM | POA: Diagnosis present

## 2017-09-23 DIAGNOSIS — B182 Chronic viral hepatitis C: Secondary | ICD-10-CM | POA: Diagnosis present

## 2017-09-23 DIAGNOSIS — Z7982 Long term (current) use of aspirin: Secondary | ICD-10-CM

## 2017-09-23 DIAGNOSIS — F329 Major depressive disorder, single episode, unspecified: Secondary | ICD-10-CM | POA: Diagnosis present

## 2017-09-23 DIAGNOSIS — J440 Chronic obstructive pulmonary disease with acute lower respiratory infection: Secondary | ICD-10-CM | POA: Diagnosis present

## 2017-09-23 DIAGNOSIS — J189 Pneumonia, unspecified organism: Secondary | ICD-10-CM | POA: Diagnosis present

## 2017-09-23 DIAGNOSIS — Z888 Allergy status to other drugs, medicaments and biological substances status: Secondary | ICD-10-CM

## 2017-09-23 DIAGNOSIS — N400 Enlarged prostate without lower urinary tract symptoms: Secondary | ICD-10-CM | POA: Insufficient documentation

## 2017-09-23 DIAGNOSIS — R7989 Other specified abnormal findings of blood chemistry: Secondary | ICD-10-CM | POA: Diagnosis present

## 2017-09-23 DIAGNOSIS — J9601 Acute respiratory failure with hypoxia: Secondary | ICD-10-CM | POA: Diagnosis present

## 2017-09-23 DIAGNOSIS — I340 Nonrheumatic mitral (valve) insufficiency: Secondary | ICD-10-CM

## 2017-09-23 DIAGNOSIS — R778 Other specified abnormalities of plasma proteins: Secondary | ICD-10-CM | POA: Diagnosis present

## 2017-09-23 DIAGNOSIS — Z91041 Radiographic dye allergy status: Secondary | ICD-10-CM

## 2017-09-23 DIAGNOSIS — J441 Chronic obstructive pulmonary disease with (acute) exacerbation: Secondary | ICD-10-CM | POA: Diagnosis present

## 2017-09-23 DIAGNOSIS — E876 Hypokalemia: Secondary | ICD-10-CM | POA: Diagnosis present

## 2017-09-23 DIAGNOSIS — Z79899 Other long term (current) drug therapy: Secondary | ICD-10-CM

## 2017-09-23 DIAGNOSIS — I10 Essential (primary) hypertension: Secondary | ICD-10-CM | POA: Diagnosis not present

## 2017-09-23 DIAGNOSIS — I5189 Other ill-defined heart diseases: Secondary | ICD-10-CM | POA: Diagnosis present

## 2017-09-23 DIAGNOSIS — J181 Lobar pneumonia, unspecified organism: Secondary | ICD-10-CM | POA: Diagnosis present

## 2017-09-23 DIAGNOSIS — I2510 Atherosclerotic heart disease of native coronary artery without angina pectoris: Secondary | ICD-10-CM | POA: Diagnosis present

## 2017-09-23 DIAGNOSIS — D649 Anemia, unspecified: Secondary | ICD-10-CM | POA: Diagnosis present

## 2017-09-23 DIAGNOSIS — F17210 Nicotine dependence, cigarettes, uncomplicated: Secondary | ICD-10-CM | POA: Diagnosis present

## 2017-09-23 DIAGNOSIS — E872 Acidosis: Secondary | ICD-10-CM | POA: Diagnosis present

## 2017-09-23 HISTORY — DX: Unspecified abdominal hernia without obstruction or gangrene: K46.9

## 2017-09-23 HISTORY — DX: Splenomegaly, not elsewhere classified: R16.1

## 2017-09-23 HISTORY — DX: Acute myocardial infarction, unspecified: I21.9

## 2017-09-23 HISTORY — DX: Atherosclerotic heart disease of native coronary artery without angina pectoris: I25.10

## 2017-09-23 HISTORY — DX: Chronic obstructive pulmonary disease, unspecified: J44.9

## 2017-09-23 HISTORY — DX: Unspecified cirrhosis of liver: K74.60

## 2017-09-23 HISTORY — DX: Peripheral vascular disease, unspecified: I73.9

## 2017-09-23 HISTORY — DX: Chronic viral hepatitis C: B18.2

## 2017-09-23 LAB — CREATININE, SERUM: CREATININE: 0.63 mg/dL (ref 0.61–1.24)

## 2017-09-23 LAB — CBC WITH DIFFERENTIAL/PLATELET
BASOS PCT: 0 %
Basophils Absolute: 0 10*3/uL (ref 0.0–0.1)
EOS PCT: 0 %
Eosinophils Absolute: 0 10*3/uL (ref 0.0–0.7)
HEMATOCRIT: 34.8 % — AB (ref 39.0–52.0)
Hemoglobin: 12.1 g/dL — ABNORMAL LOW (ref 13.0–17.0)
LYMPHS ABS: 1.5 10*3/uL (ref 0.7–4.0)
Lymphocytes Relative: 17 %
MCH: 27.4 pg (ref 26.0–34.0)
MCHC: 34.8 g/dL (ref 30.0–36.0)
MCV: 78.9 fL (ref 78.0–100.0)
MONO ABS: 0.7 10*3/uL (ref 0.1–1.0)
MONOS PCT: 8 %
NEUTROS ABS: 6.5 10*3/uL (ref 1.7–7.7)
Neutrophils Relative %: 75 %
PLATELETS: 169 10*3/uL (ref 150–400)
RBC: 4.41 MIL/uL (ref 4.22–5.81)
RDW: 15.9 % — AB (ref 11.5–15.5)
WBC: 8.7 10*3/uL (ref 4.0–10.5)

## 2017-09-23 LAB — COMPREHENSIVE METABOLIC PANEL
ALK PHOS: 76 U/L (ref 38–126)
ALT: 39 U/L (ref 17–63)
AST: 82 U/L — AB (ref 15–41)
Albumin: 2.4 g/dL — ABNORMAL LOW (ref 3.5–5.0)
Anion gap: 10 (ref 5–15)
BILIRUBIN TOTAL: 2.4 mg/dL — AB (ref 0.3–1.2)
BUN: 11 mg/dL (ref 6–20)
CALCIUM: 8.6 mg/dL — AB (ref 8.9–10.3)
CO2: 25 mmol/L (ref 22–32)
CREATININE: 0.72 mg/dL (ref 0.61–1.24)
Chloride: 98 mmol/L — ABNORMAL LOW (ref 101–111)
GFR calc Af Amer: >60 mL/min (ref >60)
Glucose, Bld: 107 mg/dL — ABNORMAL HIGH (ref 65–99)
Potassium: 4.1 mmol/L (ref 3.5–5.1)
Sodium: 133 mmol/L — ABNORMAL LOW (ref 135–145)
TOTAL PROTEIN: 6.8 g/dL (ref 6.5–8.1)

## 2017-09-23 LAB — ECHOCARDIOGRAM COMPLETE
HEIGHTINCHES: 73 in
WEIGHTICAEL: 2931.24 [oz_av]

## 2017-09-23 LAB — BRAIN NATRIURETIC PEPTIDE: B Natriuretic Peptide: 2157.3 pg/mL — ABNORMAL HIGH (ref 0.0–100.0)

## 2017-09-23 LAB — PROTIME-INR
INR: 1.24
Prothrombin Time: 15.5 seconds — ABNORMAL HIGH (ref 11.4–15.2)

## 2017-09-23 LAB — CBG MONITORING, ED: Glucose-Capillary: 115 mg/dL — ABNORMAL HIGH (ref 65–99)

## 2017-09-23 LAB — MAGNESIUM: MAGNESIUM: 1.8 mg/dL (ref 1.7–2.4)

## 2017-09-23 LAB — INFLUENZA PANEL BY PCR (TYPE A & B)
INFLAPCR: NEGATIVE
Influenza B By PCR: NEGATIVE

## 2017-09-23 LAB — I-STAT CG4 LACTIC ACID, ED
Lactic Acid, Venous: 0.98 mmol/L (ref 0.5–1.9)
Lactic Acid, Venous: 2.16 mmol/L (ref 0.5–1.9)

## 2017-09-23 LAB — CBC
HEMATOCRIT: 32.9 % — AB (ref 39.0–52.0)
Hemoglobin: 11.2 g/dL — ABNORMAL LOW (ref 13.0–17.0)
MCH: 27.1 pg (ref 26.0–34.0)
MCHC: 34 g/dL (ref 30.0–36.0)
MCV: 79.5 fL (ref 78.0–100.0)
Platelets: 139 10*3/uL — ABNORMAL LOW (ref 150–400)
RBC: 4.14 MIL/uL — AB (ref 4.22–5.81)
RDW: 16 % — ABNORMAL HIGH (ref 11.5–15.5)
WBC: 5.3 10*3/uL (ref 4.0–10.5)

## 2017-09-23 LAB — D-DIMER, QUANTITATIVE: D-Dimer, Quant: 2.96 ug/mL-FEU — ABNORMAL HIGH (ref 0.00–0.50)

## 2017-09-23 LAB — TROPONIN I
Troponin I: 0.07 ng/mL (ref <0.03)
Troponin I: 0.04 ng/mL (ref <0.03)

## 2017-09-23 MED ORDER — HYDROXYZINE HCL 25 MG PO TABS
50.0000 mg | ORAL_TABLET | Freq: Four times a day (QID) | ORAL | Status: DC | PRN
  Administered 2017-09-23 – 2017-09-24 (×2): 50 mg via ORAL
  Filled 2017-09-23 (×2): qty 2

## 2017-09-23 MED ORDER — MORPHINE SULFATE (PF) 4 MG/ML IV SOLN
4.0000 mg | Freq: Once | INTRAVENOUS | Status: AC
  Administered 2017-09-23: 4 mg via INTRAVENOUS
  Filled 2017-09-23: qty 1

## 2017-09-23 MED ORDER — ACETAMINOPHEN 325 MG PO TABS
650.0000 mg | ORAL_TABLET | Freq: Four times a day (QID) | ORAL | Status: DC | PRN
  Administered 2017-09-24 (×2): 650 mg via ORAL
  Filled 2017-09-23 (×2): qty 2

## 2017-09-23 MED ORDER — HYDROCORTISONE NA SUCCINATE PF 100 MG IJ SOLR
200.0000 mg | Freq: Once | INTRAMUSCULAR | Status: AC
  Administered 2017-09-23: 200 mg via INTRAVENOUS
  Filled 2017-09-23: qty 4

## 2017-09-23 MED ORDER — ACETAMINOPHEN 650 MG RE SUPP: 650.0000 mg | Freq: Four times a day (QID) | RECTAL | Status: DC | PRN

## 2017-09-23 MED ORDER — SODIUM CHLORIDE 0.9% FLUSH
3.0000 mL | Freq: Two times a day (BID) | INTRAVENOUS | Status: DC
  Administered 2017-09-24 – 2017-09-26 (×4): 3 mL via INTRAVENOUS

## 2017-09-23 MED ORDER — IPRATROPIUM-ALBUTEROL 0.5-2.5 (3) MG/3ML IN SOLN
3.0000 mL | Freq: Three times a day (TID) | RESPIRATORY_TRACT | Status: DC
  Administered 2017-09-23 – 2017-09-24 (×2): 3 mL via RESPIRATORY_TRACT
  Filled 2017-09-23 (×2): qty 3

## 2017-09-23 MED ORDER — IOPAMIDOL (ISOVUE-370) INJECTION 76%
INTRAVENOUS | Status: AC
  Filled 2017-09-23: qty 100

## 2017-09-23 MED ORDER — SODIUM CHLORIDE 0.9 % IV SOLN
1.0000 g | Freq: Every day | INTRAVENOUS | Status: DC
  Administered 2017-09-23 – 2017-09-26 (×4): 1 g via INTRAVENOUS
  Filled 2017-09-23 (×4): qty 1

## 2017-09-23 MED ORDER — SODIUM CHLORIDE 0.9 % IV BOLUS (SEPSIS)
1000.0000 mL | Freq: Once | INTRAVENOUS | Status: AC
Start: 2017-09-23 — End: 2017-09-23
  Administered 2017-09-23: 1000 mL via INTRAVENOUS

## 2017-09-23 MED ORDER — IOPAMIDOL (ISOVUE-370) INJECTION 76%
100.0000 mL | Freq: Once | INTRAVENOUS | Status: AC | PRN
  Administered 2017-09-23: 100 mL via INTRAVENOUS

## 2017-09-23 MED ORDER — QUETIAPINE FUMARATE 100 MG PO TABS
100.0000 mg | ORAL_TABLET | Freq: Every day | ORAL | Status: DC
  Administered 2017-09-23 – 2017-09-26 (×4): 100 mg via ORAL
  Filled 2017-09-23 (×4): qty 1

## 2017-09-23 MED ORDER — TAMSULOSIN HCL 0.4 MG PO CAPS
0.4000 mg | ORAL_CAPSULE | Freq: Every day | ORAL | Status: DC
  Administered 2017-09-23 – 2017-09-27 (×5): 0.4 mg via ORAL
  Filled 2017-09-23 (×5): qty 1

## 2017-09-23 MED ORDER — DIPHENHYDRAMINE HCL 50 MG/ML IJ SOLN
50.0000 mg | Freq: Once | INTRAMUSCULAR | Status: AC
  Administered 2017-09-23: 50 mg via INTRAVENOUS
  Filled 2017-09-23: qty 1

## 2017-09-23 MED ORDER — NITROGLYCERIN 0.4 MG SL SUBL: 0.4000 mg | SUBLINGUAL_TABLET | SUBLINGUAL | Status: DC | PRN

## 2017-09-23 MED ORDER — MORPHINE SULFATE (PF) 4 MG/ML IV SOLN
4.0000 mg | INTRAVENOUS | Status: DC | PRN
  Administered 2017-09-23 – 2017-09-25 (×14): 4 mg via INTRAVENOUS
  Filled 2017-09-23 (×15): qty 1

## 2017-09-23 MED ORDER — ASPIRIN 81 MG PO CHEW
324.0000 mg | CHEWABLE_TABLET | Freq: Once | ORAL | Status: DC
  Filled 2017-09-23: qty 4

## 2017-09-23 MED ORDER — DIPHENHYDRAMINE HCL 25 MG PO CAPS: 50.0000 mg | ORAL_CAPSULE | Freq: Once | ORAL | Status: AC

## 2017-09-23 MED ORDER — GUAIFENESIN ER 600 MG PO TB12: 600.0000 mg | ORAL_TABLET | Freq: Two times a day (BID) | ORAL | Status: DC

## 2017-09-23 MED ORDER — SODIUM CHLORIDE 0.9 % IV SOLN
2.0000 g | Freq: Once | INTRAVENOUS | Status: AC
  Administered 2017-09-23: 2 g via INTRAVENOUS
  Filled 2017-09-23: qty 2

## 2017-09-23 MED ORDER — SODIUM CHLORIDE 0.9 % IJ SOLN
INTRAMUSCULAR | Status: AC
  Filled 2017-09-23: qty 50

## 2017-09-23 MED ORDER — BENZONATATE 100 MG PO CAPS
100.0000 mg | ORAL_CAPSULE | Freq: Three times a day (TID) | ORAL | Status: DC | PRN
  Administered 2017-09-23 – 2017-09-24 (×4): 100 mg via ORAL
  Filled 2017-09-23 (×4): qty 1

## 2017-09-23 MED ORDER — ONDANSETRON HCL 4 MG PO TABS: 4.0000 mg | ORAL_TABLET | Freq: Four times a day (QID) | ORAL | Status: DC | PRN

## 2017-09-23 MED ORDER — METOPROLOL SUCCINATE ER 50 MG PO TB24
50.0000 mg | ORAL_TABLET | Freq: Every day | ORAL | Status: DC
  Administered 2017-09-23 – 2017-09-27 (×5): 50 mg via ORAL
  Filled 2017-09-23 (×5): qty 1

## 2017-09-23 MED ORDER — VANCOMYCIN HCL IN DEXTROSE 1-5 GM/200ML-% IV SOLN
1000.0000 mg | Freq: Once | INTRAVENOUS | Status: AC
  Administered 2017-09-23: 1000 mg via INTRAVENOUS
  Filled 2017-09-23: qty 200

## 2017-09-23 MED ORDER — ENOXAPARIN SODIUM 40 MG/0.4ML ~~LOC~~ SOLN
40.0000 mg | SUBCUTANEOUS | Status: DC
  Administered 2017-09-23 – 2017-09-27 (×5): 40 mg via SUBCUTANEOUS
  Filled 2017-09-23 (×5): qty 0.4

## 2017-09-23 MED ORDER — KCL IN DEXTROSE-NACL 20-5-0.45 MEQ/L-%-% IV SOLN
INTRAVENOUS | Status: AC
  Administered 2017-09-23: 11:00:00 via INTRAVENOUS
  Filled 2017-09-23 (×3): qty 1000

## 2017-09-23 MED ORDER — ASPIRIN 81 MG PO CHEW
81.0000 mg | CHEWABLE_TABLET | Freq: Every day | ORAL | Status: DC
  Administered 2017-09-23 – 2017-09-27 (×5): 81 mg via ORAL
  Filled 2017-09-23 (×5): qty 1

## 2017-09-23 MED ORDER — SODIUM CHLORIDE 0.9 % IV BOLUS (SEPSIS)
1000.0000 mL | Freq: Once | INTRAVENOUS | Status: AC
  Administered 2017-09-23: 1000 mL via INTRAVENOUS

## 2017-09-23 MED ORDER — ESCITALOPRAM OXALATE 20 MG PO TABS
20.0000 mg | ORAL_TABLET | Freq: Every day | ORAL | Status: DC
  Administered 2017-09-23 – 2017-09-27 (×5): 20 mg via ORAL
  Filled 2017-09-23 (×5): qty 1

## 2017-09-23 MED ORDER — HYDRALAZINE HCL 20 MG/ML IJ SOLN: 10.0000 mg | Freq: Three times a day (TID) | INTRAMUSCULAR | Status: DC | PRN

## 2017-09-23 MED ORDER — NICOTINE 21 MG/24HR TD PT24
21.0000 mg | MEDICATED_PATCH | Freq: Every day | TRANSDERMAL | Status: DC
  Administered 2017-09-23 – 2017-09-27 (×5): 21 mg via TRANSDERMAL
  Filled 2017-09-23 (×5): qty 1

## 2017-09-23 MED ORDER — IPRATROPIUM-ALBUTEROL 0.5-2.5 (3) MG/3ML IN SOLN
3.0000 mL | Freq: Four times a day (QID) | RESPIRATORY_TRACT | Status: DC
  Administered 2017-09-23: 3 mL via RESPIRATORY_TRACT
  Filled 2017-09-23: qty 3

## 2017-09-23 MED ORDER — ALBUTEROL SULFATE (2.5 MG/3ML) 0.083% IN NEBU: 2.5000 mg | INHALATION_SOLUTION | RESPIRATORY_TRACT | Status: DC | PRN

## 2017-09-23 MED ORDER — DOXYCYCLINE HYCLATE 100 MG IV SOLR
100.0000 mg | Freq: Two times a day (BID) | INTRAVENOUS | Status: DC
  Administered 2017-09-23 – 2017-09-24 (×3): 100 mg via INTRAVENOUS
  Filled 2017-09-23 (×4): qty 100

## 2017-09-23 MED ORDER — ONDANSETRON HCL 4 MG/2ML IJ SOLN: 4.0000 mg | Freq: Four times a day (QID) | INTRAMUSCULAR | Status: DC | PRN

## 2017-09-23 MED ORDER — ACETAMINOPHEN 500 MG PO TABS
1000.0000 mg | ORAL_TABLET | Freq: Once | ORAL | Status: AC
  Administered 2017-09-23: 1000 mg via ORAL
  Filled 2017-09-23: qty 2

## 2017-09-23 NOTE — ED Notes (Signed)
Bed: WA17 Expected date:  Expected time:  Means of arrival:  Comments: Ems chest pain, hypertensive

## 2017-09-23 NOTE — Progress Notes (Signed)
PT demonstrated hands on understanding of Flutter device. 

## 2017-09-23 NOTE — ED Provider Notes (Signed)
[facility]-EMERGENCY DEPT Provider Note   CSN: [id number] Arrival date & time: 09/23/17  0018     History   Chief Complaint Chief Complaint  Patient presents with  . Chest Pain  . Fever    HPI Edward Little is a 57 y.o. male.  HPI Presents with concern of ongoing chest pain. This episode began yesterday, since onset patient has had pain focally in the left mid lower chest. Pain is sharp, severe, worse with inspiration. There is no cough, no nausea, no vomiting. Patient acknowledges multiple medical issues including CAD, PVD, substance abuse disorder. Since onset, symptoms are worse with inspiration, otherwise no clear alleviating or exacerbating factors. EMS notes the patient was hypertensive, but otherwise unremarkable in route.  Past Medical History:  Diagnosis Date  . AAA (abdominal aortic aneurysm) (HCC)   . Cellulitis     Patient Active Problem List   Diagnosis Date Noted  . Opioid use disorder, severe, dependence (HCC) 08/17/2017  . MDD (major depressive disorder), severe (HCC) 08/16/2017    Past Surgical History:  Procedure Laterality Date  . ABDOMINAL AORTIC ANEURYSM REPAIR W/ ENDOLUMINAL GRAFT    . revision of AAA graft         Home Medications    Prior to Admission medications   Medication Sig Start Date End Date Taking? Authorizing Provider  albuterol (PROVENTIL HFA;VENTOLIN HFA) 108 (90 Base) MCG/ACT inhaler Inhale 2 puffs into the lungs every 4 (four) hours as needed for wheezing. 08/20/17   Armandina Stammer I, NP  aspirin 81 MG chewable tablet Chew 1 tablet (81 mg total) by mouth daily. For heart health 08/21/17   Armandina Stammer I, NP  cyclobenzaprine (FLEXERIL) 10 MG tablet Take 1 tablet (10 mg total) by mouth 2 (two) times daily as needed for muscle spasms. 08/20/17   Armandina Stammer I, NP  escitalopram (LEXAPRO) 20 MG tablet Take 1 tablet (20 mg total) by mouth daily. For depression 08/21/17   Armandina Stammer I, NP  hydrOXYzine  (ATARAX/VISTARIL) 50 MG tablet Take 1 tablet (50 mg total) by mouth every 6 (six) hours as needed for anxiety. 08/20/17   Armandina Stammer I, NP  metoprolol tartrate (LOPRESSOR) 25 MG tablet Take 1 tablet (25 mg total) by mouth 2 (two) times daily. For high blood pressure 08/20/17   Nwoko, Nicole Kindred I, NP  nicotine (NICODERM CQ - DOSED IN MG/24 HOURS) 21 mg/24hr patch Place 1 patch (21 mg total) onto the skin daily. For smoking cessation 08/21/17   Armandina Stammer I, NP  ondansetron (ZOFRAN-ODT) 4 MG disintegrating tablet Take 1 tablet (4 mg total) by mouth every 6 (six) hours as needed for nausea or vomiting. 08/20/17   Armandina Stammer I, NP  QUEtiapine (SEROQUEL) 100 MG tablet Take 1 tablet (100 mg total) by mouth at bedtime. For mood control 08/20/17   Armandina Stammer I, NP  tamsulosin (FLOMAX) 0.4 MG CAPS capsule Take 1 capsule (0.4 mg total) by mouth daily. For prostate health 08/21/17   Sanjuana Kava, NP    Family History History reviewed. No pertinent family history.  Social History Social History   Tobacco Use  . Smoking status: Current Every Day Smoker    Packs/day: 1.00    Types: Cigarettes  . Smokeless tobacco: Current User  Substance Use Topics  . Alcohol use: No  . Drug use: Yes    Comment: heroine     Allergies   Iodinated diagnostic agents; Ketorolac tromethamine; and Tape   Review of Systems  Review of Systems  Constitutional:       Per HPI, otherwise negative  HENT:       Per HPI, otherwise negative  Respiratory:       Per HPI, otherwise negative  Cardiovascular:       Per HPI, otherwise negative  Gastrointestinal: Negative for vomiting.  Endocrine:       Negative aside from HPI  Genitourinary:       Neg aside from HPI   Musculoskeletal:       Per HPI, otherwise negative  Skin: Negative.   Neurological: Negative for syncope.     Physical Exam Updated Vital Signs BP (!) 155/83 (BP Location: Right Arm)   Pulse 97   Temp (!) 101.4 F (38.6 C) (Rectal)   Resp 14    SpO2 97%   Physical Exam  Constitutional: He is oriented to person, place, and time. He appears well-developed. No distress.  HENT:  Head: Normocephalic and atraumatic.  Eyes: Conjunctivae and EOM are normal.  Cardiovascular: Normal rate and regular rhythm.  Pulmonary/Chest: Effort normal. No stridor. No respiratory distress.  Abdominal: He exhibits no distension.  Musculoskeletal: He exhibits no edema.  Neurological: He is alert and oriented to person, place, and time.  Skin: Skin is warm and dry.  Psychiatric: He has a normal mood and affect.  Nursing note and vitals reviewed.    ED Treatments / Results  Labs (all labs ordered are listed, but only abnormal results are displayed) Labs Reviewed  COMPREHENSIVE METABOLIC PANEL - Abnormal; Notable for the following components:      Result Value   Sodium 133 (*)    Chloride 98 (*)    Glucose, Bld 107 (*)    Calcium 8.6 (*)    Albumin 2.4 (*)    AST 82 (*)    Total Bilirubin 2.4 (*)    All other components within normal limits  TROPONIN I - Abnormal; Notable for the following components:   Troponin I 0.07 (*)    All other components within normal limits  BRAIN NATRIURETIC PEPTIDE - Abnormal; Notable for the following components:   B Natriuretic Peptide 2,157.3 (*)    All other components within normal limits  CBC WITH DIFFERENTIAL/PLATELET - Abnormal; Notable for the following components:   Hemoglobin 12.1 (*)    HCT 34.8 (*)    RDW 15.9 (*)    All other components within normal limits  CBG MONITORING, ED - Abnormal; Notable for the following components:   Glucose-Capillary 115 (*)    All other components within normal limits  I-STAT CG4 LACTIC ACID, ED - Abnormal; Notable for the following components:   Lactic Acid, Venous 2.16 (*)    All other components within normal limits  MAGNESIUM  PROTIME-INR  D-DIMER, QUANTITATIVE (NOT AT [facility])    EMS rhythm strip with sinus rhythm, rate 95, T wave changes, artifact,  abnormal   EKG  EKG Interpretation  Date/Time:  Sunday September 23 2017 00:26:03 EST Ventricular Rate:  91 PR Interval:    QRS Duration: 100 QT Interval:  443 QTC Calculation: 546 R Axis:   82 Text Interpretation:  Sinus rhythm Consider left atrial enlargement Probable left ventricular hypertrophy Nonspecific T abnormalities, lateral leads Baseline wander Abnormal ekg Confirmed by Gerhard Munch [phone]) on 09/23/2017 12:31:22 AM       Radiology Dg Chest Portable 1 View  Result Date: 09/23/2017 CLINICAL DATA:  57 y/o  M; chest pain and shortness of breath. EXAM: PORTABLE CHEST 1  VIEW COMPARISON:  08/16/2017 chest radiograph. FINDINGS: Normal cardiac silhouette. Aortic atherosclerosis with calcification. Left mid and lower lung zone consolidation. No pleural effusion or pneumothorax. No acute osseous abnormality is evident. IMPRESSION: Left mid and lower lung zone consolidation likely representing pneumonia. Electronically Signed   By: Mitzi HansenLance  Furusawa-Stratton M.D.   On: 09/23/2017 00:56    Procedures Procedures (including critical care time)  Medications Ordered in ED Medications  aspirin chewable tablet 324 mg (324 mg Oral Not Given 09/23/17 0052)  ceFEPIme (MAXIPIME) 2 g in sodium chloride 0.9 % 100 mL IVPB (not administered)  vancomycin (VANCOCIN) IVPB 1000 mg/200 mL premix (not administered)  morphine 4 MG/ML injection 4 mg (4 mg Intravenous Given 09/23/17 0053)  sodium chloride 0.9 % bolus 1,000 mL (1,000 mLs Intravenous New Bag/Given 09/23/17 0058)     Initial Impression / Assessment and Plan / ED Course  I have reviewed the triage vital signs and the nursing notes.  Pertinent labs & imaging results that were available during my care of the patient were reviewed by me and considered in my medical decision making (see chart for details).  Initial labs notable for lactic acidosis. Given the patient's chest pain, lactic acidosis, concern for infection, though given his history, ACS  versus other acute chest pathology was considered. D-dimer pending.  Update, troponin 0 0.07, BNP greater than 2000. Patient has received aspirin. On repeat exam his chest pain has resolved, he states that he feels better, now with supplemental oxygen Room air saturation was 90%, now with 2 L it is 100%.  2:35 AM Remaining labs notable for d-dimer that is elevated, and given the patient's chest pain, hypoxia, and his history, CT imaging is indicated. Patient is a allergy to contrast material, will require hydrocortisone, Benadryl, to diminish allergic reaction. 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  3:08 AM She confirms that he has contrast allergy, is receiving hydrocodone cortisone. Patient is eating a sandwich, notes that his chest pain has improved substantially. Absent distress, no indication to prematurely do the CT scan. With no ongoing pain, and suspicion for pneumonia causing his elevated troponin, with metabolic demand, patient will not start heparin, pending CT findings and history of dissection.  7:07 AM Patient remains calm, substantially improved since arrival. CT finding reviewed, negative for PE, consistent with x-ray evidence of multifocal pneumonia.  , Given the patient's recent hospitalization, new findings of multifocal pneumonia, hypoxia, patient will require admission for further evaluation and management. He has already received broad-spectrum antibiotics, aspirin. Though the patient does have elevated BNP, troponin, no evidence for decompensated heart failure, and with no ongoing chest pain, low suspicion for substantial coronary ischemia.  Final Clinical Impressions(s) / ED Diagnoses   Final diagnoses:  Multifocal pneumonia  Elevated troponin   CRITICAL CARE Performed by: Gerhard Munchobert Lajeana Strough Total critical care time: 40 minutes Critical care time was exclusive of  separately billable procedures and treating other patients. Critical care was necessary to treat or prevent imminent or life-threatening deterioration. Critical care was time spent personally by me on the following activities: development of treatment plan with patient and/or surrogate as well as nursing, discussions with consultants, evaluation of patient's response to treatment, examination of patient, obtaining history from patient or surrogate, ordering and performing treatments and interventions, ordering and review of laboratory studies, ordering and review of radiographic studies, pulse oximetry and re-evaluation of patient's condition.    Gerhard MunchLockwood, Latamara Melder, MD 09/23/17 [id number]

## 2017-09-23 NOTE — H&P (Signed)
[facility] History and Physical  Edward Little VHQ:[id number] DOB: 1961/02/11 DOA: 09/23/2017  Referring physician:  PCP: [name], MD   Chief Complaint: "I just felt bad."  HPI: Edward Little is a 57 y.o. male with past medical history significant for peripheral vascular disease heart attack smoking presents to the hospital with chief complaint of feeling ill and chest discomfort.  Patient states he has had increasing left-sided chest pain and cough for 2 days.  Cough is nonproductive.  Denies any chest injury.  No known sick contacts.  Has had fevers and chills.  Patient states he has had some sweating in between.  He is not sure if he got a flu shot about this year.  ED course: Patient given Vanco and cefepime.  Troponin weakly positive.  Given aspirin.  CTA done to check for PE was negative for PE but did show multifocal pneumonia.  Hospitalist consulted for admission.   Review of Systems:  As per HPI otherwise 10 point review of systems negative.    Past Medical History:  Diagnosis Date  . AAA (abdominal aortic aneurysm) (HCC)   . Abdominal hernia   . Cellulitis   . Cirrhosis of liver (HCC)   . Heart attack (HCC)   . PVD (peripheral vascular disease) (HCC)   . Spleen enlarged    Past Surgical History:  Procedure Laterality Date  . ABDOMINAL AORTIC ANEURYSM REPAIR W/ ENDOLUMINAL GRAFT    . CHOLECYSTECTOMY    . HERNIA REPAIR    . periperial stent    . revision of AAA graft     Social History:  reports that he has been smoking cigarettes.  He has been smoking about 1.00 pack per day. He uses smokeless tobacco. He reports that he uses drugs. He reports that he does not drink alcohol.  Allergies  Allergen Reactions  . Iodinated Diagnostic Agents Hives  . Ketorolac Tromethamine Diarrhea    Stomach cramps  . Tape Itching    Family History  Problem Relation Age of Onset  . Cancer - Other Mother        bone, melenoma  . Heart attack Paternal Grandfather      Died 89     Prior to Admission medications   Medication Sig Start Date End Date Taking? Authorizing Provider  albuterol (PROVENTIL HFA;VENTOLIN HFA) 108 (90 Base) MCG/ACT inhaler Inhale 2 puffs into the lungs every 4 (four) hours as needed for wheezing. 08/20/17  Yes Armandina Stammer I, NP  aspirin 81 MG chewable tablet Chew 1 tablet (81 mg total) by mouth daily. For heart health 08/21/17  Yes Armandina Stammer I, NP  cyclobenzaprine (FLEXERIL) 10 MG tablet Take 1 tablet (10 mg total) by mouth 2 (two) times daily as needed for muscle spasms. 08/20/17  Yes Armandina Stammer I, NP  escitalopram (LEXAPRO) 20 MG tablet Take 1 tablet (20 mg total) by mouth daily. For depression 08/21/17  Yes Armandina Stammer I, NP  hydrOXYzine (ATARAX/VISTARIL) 50 MG tablet Take 1 tablet (50 mg total) by mouth every 6 (six) hours as needed for anxiety. 08/20/17  Yes Armandina Stammer I, NP  metoprolol succinate (TOPROL-XL) 50 MG 24 hr tablet Take 50 mg by mouth daily. Take with or immediately following a meal.   Yes [provider]  ondansetron (ZOFRAN-ODT) 4 MG disintegrating tablet Take 1 tablet (4 mg total) by mouth every 6 (six) hours as needed for nausea or vomiting. 08/20/17  Yes Armandina Stammer I, NP  QUEtiapine (SEROQUEL) 100 MG tablet  Take 1 tablet (100 mg total) by mouth at bedtime. For mood control 08/20/17  Yes Armandina Stammer I, NP  tamsulosin (FLOMAX) 0.4 MG CAPS capsule Take 1 capsule (0.4 mg total) by mouth daily. For prostate health 08/21/17  Yes Armandina Stammer I, NP  metoprolol tartrate (LOPRESSOR) 25 MG tablet Take 1 tablet (25 mg total) by mouth 2 (two) times daily. For high blood pressure Patient not taking: Reported on 09/23/2017 08/20/17   Armandina Stammer I, NP  nicotine (NICODERM CQ - DOSED IN MG/24 HOURS) 21 mg/24hr patch Place 1 patch (21 mg total) onto the skin daily. For smoking cessation Patient not taking: Reported on 09/23/2017 08/21/17   Armandina Stammer I, NP   Physical Exam: Vitals:   09/23/17 0530 09/23/17 0650 09/23/17  0655 09/23/17 0746  BP: 140/61 (!) 119/49 (!) 157/103 (!) 176/94  Pulse: 72 71 80 77  Resp: 20 20 19 16   Temp:   97.8 F (36.6 C) (!) 97.3 F (36.3 C)  TempSrc:   Oral Oral  SpO2: 93% 93% 97% 96%  Weight:   83.9 kg (185 lb)   Height:   6\' 1"  (1.854 m)     Wt Readings from Last 3 Encounters:  09/23/17 83.9 kg (185 lb)  08/30/17 81.6 kg (180 lb)  08/16/17 83.9 kg (185 lb)    General:  Appears calm and comfortable; A&Ox3 Eyes:  PERRL, EOMI, normal lids, iris ENT:  grossly normal hearing, lips & tongue Neck:  no LAD, masses or thyromegaly Cardiovascular:  RRR, no m/r/g. No LE edema.  Respiratory:  CTAB, tachypnea, incr wob Abdomen:  soft, ntnd Skin:  no rash or induration seen on limited exam Musculoskeletal:  grossly normal tone BUE/BLE Psychiatric:  grossly normal mood and affect, speech fluent and appropriate Neurologic:  CN 2-12 grossly intact, moves all extremities in coordinated fashion.          Labs on Admission:  Basic Metabolic Panel: Recent Labs  Lab 09/23/17 0033  NA 133*  K 4.1  CL 98*  CO2 25  GLUCOSE 107*  BUN 11  CREATININE 0.72  CALCIUM 8.6*  MG 1.8   Liver Function Tests: Recent Labs  Lab 09/23/17 0033  AST 82*  ALT 39  ALKPHOS 76  BILITOT 2.4*  PROT 6.8  ALBUMIN 2.4*   No results for input(s): LIPASE, AMYLASE in the last 168 hours. No results for input(s): AMMONIA in the last 168 hours. CBC: Recent Labs  Lab 09/23/17 0033  WBC 8.7  NEUTROABS 6.5  HGB 12.1*  HCT 34.8*  MCV 78.9  PLT 169   Cardiac Enzymes: Recent Labs  Lab 09/23/17 0033  TROPONINI 0.07*    BNP (last 3 results) Recent Labs    09/23/17 0033  BNP 2,157.3*    ProBNP (last 3 results) No results for input(s): PROBNP in the last 8760 hours.   Serum creatinine: 0.72 mg/dL 16/10/96 0454 Estimated creatinine clearance: 116.5 mL/min  CBG: Recent Labs  Lab 09/23/17 0033  GLUCAP 115*    Radiological Exams on Admission: Ct Angio Chest Pe W/cm &/or Wo  Cm  Result Date: 09/23/2017 CLINICAL DATA:  57 y/o M; fever and chest pain. Elevated D-dimer. Productive cough with green sputum. EXAM: CT ANGIOGRAPHY CHEST WITH CONTRAST TECHNIQUE: Multidetector CT imaging of the chest was performed using the standard protocol during bolus administration of intravenous contrast. Multiplanar CT image reconstructions and MIPs were obtained to evaluate the vascular anatomy. CONTRAST:  ISOVUE-370 IOPAMIDOL (ISOVUE-370) INJECTION 76% COMPARISON:  09/23/2017  chest radiograph FINDINGS: Cardiovascular: Satisfactory opacification of the pulmonary arteries. Mild respiratory artifact. Dense contrast in the SVC with streak artifact suprahilar region. No pulmonary embolus identified. Normal heart size. Normal caliber thoracic aorta and main pulmonary artery. No pericardial effusion. Mediastinum/Nodes: Diffuse prominence of pulmonary lymph nodes measuring up to 14 x 13 mm in the left hilum (series 5, image 137). Calcified right-sided hilar lymph nodes. Mildly patulous thoracic esophagus. Normal thyroid gland. Lungs/Pleura: Diffuse patchy ground-glass opacities with dense consolidation in the left upper lobe and left lower lobes compatible with multifocal pneumonia. Moderate centrilobular emphysema. No pleural effusion or pneumothorax. Upper Abdomen: Partially visualized splenomegaly. Musculoskeletal: Chronic right fifth anterolateral rib fracture. No acute osseous abnormality identified. Review of the MIP images confirms the above findings. IMPRESSION: 1. No pulmonary embolus identified. 2. Multifocal pneumonia. No cavitation. Mediastinal lymph nodes greatest in left hilar region, likely reactive. Follow-up to resolution recommended to exclude underlying malignancy. 3. Moderate centrilobular emphysema. 4. Partially visualized splenomegaly. Electronically Signed   By: Mitzi HansenLance  Furusawa-Stratton M.D.   On: 09/23/2017 06:14   Dg Chest Portable 1 View  Result Date: 09/23/2017 CLINICAL DATA:   57 y/o  M; chest pain and shortness of breath. EXAM: PORTABLE CHEST 1 VIEW COMPARISON:  08/16/2017 chest radiograph. FINDINGS: Normal cardiac silhouette. Aortic atherosclerosis with calcification. Left mid and lower lung zone consolidation. No pleural effusion or pneumothorax. No acute osseous abnormality is evident. IMPRESSION: Left mid and lower lung zone consolidation likely representing pneumonia. Electronically Signed   By: Mitzi HansenLance  Furusawa-Stratton M.D.   On: 09/23/2017 00:56    EKG: Independently reviewed. No stemi.  Assessment/Plan Principal Problem:   CAP (community acquired pneumonia) Active Problems:   MDD (major depressive disorder), severe (HCC)   Essential hypertension   Elevated troponin   BPH (benign prostatic hyperplasia)  CAP PSI risk class III Scheduled DuoNeb's When necessary albuterol Antibiotic- Vanc and Cefepime Oxygen therapy Continuous pulse oximetry Mucinex BID Flutter IS Checking Flu  Hypertension When necessary hydralazine 10 mg IV as needed for severe blood pressure Cont toprol xl  Elevated trop Will trend ECHO ordered ASA in ED Ntg sl prn cp  Tobacco abuse Nicotine patch  Depression/anxiety Cont lexapro, serroquel Prn atarax No SI/HI  CAD Cont asa  BPH Cont flomax  Code Status: FC  DVT Prophylaxis: lovenox Family Communication: none available, call [place] MethMartha Plotts in emergency [phone] Disposition Plan: Pending Improvement  Status: obs tele  Haydee SalterPhillip M Hobbs, MD Family Medicine [facility] [email or web address] Password [email or web address]

## 2017-09-23 NOTE — Progress Notes (Signed)
  Echocardiogram 2D Echocardiogram has been performed.  Edward Little, Edward Little F 09/23/2017, 3:49 PM

## 2017-09-23 NOTE — Plan of Care (Addendum)
Discussed with Dr. Jeraldine LootsLockwood.   Mr. Edward Little is a 57 year old male with complicated PMH; who presented with complaints of chest pain.  Found to have a multifocal pneumonia.  Troponin 0.07, BNP 2157. sepsis protocol initiated.  CT angiogram of the chest shows no signs of pulmonary embolus or dissection.  Patient had been given 1 L of normal saline fluids along with vancomycin, cefepime, 200 mg hydrocortisone, and DuoNeb breathing treatment.  [facility] called to admit.  Patient reported to be chest pain-free.

## 2017-09-23 NOTE — ED Triage Notes (Signed)
Pt presenting with fever and chest pain. Patient states that chest pain started yesterday, but has gradually gotten worse. Tympanic temp per EMS 101.8. Patient complaining of a productive cough with green sputum. 94% RA, 98% 4L [place]. Patient does have extensive cardiac hx.

## 2017-09-23 NOTE — ED Notes (Signed)
Rn Artistnotified writer that she would attempt to get labs from IV once fluids/antibiotics were complete.

## 2017-09-23 NOTE — ED Notes (Signed)
ED TO INPATIENT HANDOFF REPORT  Name/Age/Gender Edward Little 57 y.o. male  Code Status Code Status History    Date Active Date Inactive Code Status Order ID Comments User Context   08/16/2017 23:49 08/21/2017 10:13 Full Code [id number]  Consuello Closs, NP Inpatient   08/16/2017 23:49 08/16/2017 23:49 Full Code [id number]  Consuello Closs, NP Inpatient   08/16/2017 15:18 08/16/2017 23:09 Full Code [id number]  Pisciotta, Charna Elizabeth ED      Home/SNF/Other Home  Chief Complaint chest pains  Level of Care/Admitting Diagnosis ED Disposition    ED Disposition Condition [facility] [facility] Area: [facility] [100102]  Level of Care: Telemetry [5]  Admit to tele based on following criteria: Monitor for Ischemic changes  Diagnosis: CAP (community acquired pneumonia) [275170]  Admitting Physician: Elwin Mocha [[id number]]  Attending Physician: Aggie Moats, Layne Benton [[id number]]  PT Class (Do Not Modify): Observation [104]  PT Acc Code (Do Not Modify): Observation [10022]       Medical History Past Medical History:  Diagnosis Date  . AAA (abdominal aortic aneurysm) ([place])   . Cellulitis     Allergies Allergies  Allergen Reactions  . Iodinated Diagnostic Agents Hives  . Ketorolac Tromethamine Diarrhea    Stomach cramps  . Tape Itching    IV Location/Drains/Wounds Patient Lines/Drains/Airways Status   Active Line/Drains/Airways    Name:   Placement date:   Placement time:   Site:   Days:   Peripheral IV 09/23/17 Right;Posterior;Lateral Forearm   09/23/17    0040    Forearm   less than 1          Labs/Imaging Results for orders placed or performed during the hospital encounter of 09/23/17 (from the past 48 hour(s))  CBG monitoring, ED     Status: Abnormal   Collection Time: 09/23/17 12:33 AM  Result Value Ref Range   Glucose-Capillary 115 (H) 65 - 99 mg/dL  Comprehensive metabolic panel     Status: Abnormal   Collection Time: 09/23/17 12:33 AM   Result Value Ref Range   Sodium 133 (L) 135 - 145 mmol/L   Potassium 4.1 3.5 - 5.1 mmol/L   Chloride 98 (L) 101 - 111 mmol/L   CO2 25 22 - 32 mmol/L   Glucose, Bld 107 (H) 65 - 99 mg/dL   BUN 11 6 - 20 mg/dL   Creatinine, Ser 0.72 0.61 - 1.24 mg/dL   Calcium 8.6 (L) 8.9 - 10.3 mg/dL   Total Protein 6.8 6.5 - 8.1 g/dL   Albumin 2.4 (L) 3.5 - 5.0 g/dL   AST 82 (H) 15 - 41 U/L   ALT 39 17 - 63 U/L   Alkaline Phosphatase 76 38 - 126 U/L   Total Bilirubin 2.4 (H) 0.3 - 1.2 mg/dL   GFR calc non Af Amer >60 >60 mL/min   GFR calc Af Amer >60 >60 mL/min    Comment: (NOTE) The eGFR has been calculated using the CKD EPI equation. This calculation has not been validated in all clinical situations. eGFR's persistently <60 mL/min signify possible Chronic Kidney Disease.    Anion gap 10 5 - 15    Comment: Performed at [facility], [place] [address], [place], [place] [address]  Magnesium     Status: None   Collection Time: 09/23/17 12:33 AM  Result Value Ref Range   Magnesium 1.8 1.7 - 2.4 mg/dL    Comment: Performed at [facility], [place]  [address] [place], [place] [address]  Troponin I (order at [facility])     Status: Abnormal   Collection Time: 09/23/17 12:33 AM  Result Value Ref Range   Troponin I 0.07 (HH) <0.03 ng/mL    Comment: CRITICAL RESULT CALLED TO, READ BACK BY AND VERIFIED WITH: Metta Clines RN AT 0141 ON 09/22/17 BY Celesta Gentile Performed at [facility], [place] [address] [place], [place] [address]   Brain natriuretic peptide (order if patient c/o SOB ONLY)     Status: Abnormal   Collection Time: 09/23/17 12:33 AM  Result Value Ref Range   B Natriuretic Peptide 2,157.3 (H) 0.0 - 100.0 pg/mL    Comment: Performed at [facility], [place] [address] [place], [place] [address]  CBC with Differential/Platelet     Status: Abnormal   Collection Time: 09/23/17 12:33 AM  Result Value Ref Range   WBC 8.7 4.0 - 10.5 K/uL   RBC  4.41 4.22 - 5.81 MIL/uL   Hemoglobin 12.1 (L) 13.0 - 17.0 g/dL   HCT 34.8 (L) 39.0 - 52.0 %   MCV 78.9 78.0 - 100.0 fL   MCH 27.4 26.0 - 34.0 pg   MCHC 34.8 30.0 - 36.0 g/dL   RDW 15.9 (H) 11.5 - 15.5 %   Platelets 169 150 - 400 K/uL   Neutrophils Relative % 75 %   Lymphocytes Relative 17 %   Monocytes Relative 8 %   Eosinophils Relative 0 %   Basophils Relative 0 %   Neutro Abs 6.5 1.7 - 7.7 K/uL   Lymphs Abs 1.5 0.7 - 4.0 K/uL   Monocytes Absolute 0.7 0.1 - 1.0 K/uL   Eosinophils Absolute 0.0 0.0 - 0.7 K/uL   Basophils Absolute 0.0 0.0 - 0.1 K/uL   WBC Morphology ATYPICAL LYMPHOCYTES     Comment: Performed at [facility], [place] [address] [place], [place] [address]  Protime-INR     Status: Abnormal   Collection Time: 09/23/17 12:33 AM  Result Value Ref Range   Prothrombin Time 15.5 (H) 11.4 - 15.2 seconds   INR 1.24     Comment: Performed at [facility], [place] [address], [place], [place] [address]  D-dimer, quantitative (not at [facility])     Status: Abnormal   Collection Time: 09/23/17 12:33 AM  Result Value Ref Range   D-Dimer, Quant 2.96 (H) 0.00 - 0.50 ug/mL-FEU    Comment: (NOTE) At the manufacturer cut-off of 0.50 ug/mL FEU, this assay has been documented to exclude PE with a sensitivity and negative predictive value of 97 to 99%.  At this time, this assay has not been approved by the FDA to exclude DVT/VTE. Results should be correlated with clinical presentation. Performed at [facility], [place] [address], [place], [place] [address]   I-Stat CG4 Lactic Acid, ED     Status: Abnormal   Collection Time: 09/23/17 12:45 AM  Result Value Ref Range   Lactic Acid, Venous 2.16 (HH) 0.5 - 1.9 mmol/L   Comment NOTIFIED PHYSICIAN   I-Stat CG4 Lactic Acid, ED     Status: None   Collection Time: 09/23/17  4:09 AM  Result Value Ref Range   Lactic Acid, Venous 0.98 0.5 - 1.9 mmol/L   Ct Angio Chest Pe W/cm &/or Wo  Cm  Result Date: 09/23/2017 CLINICAL DATA:  57 y/o M; fever and chest pain. Elevated D-dimer. Productive cough with green sputum. EXAM: CT ANGIOGRAPHY CHEST WITH CONTRAST TECHNIQUE: Multidetector CT  imaging of the chest was performed using the standard protocol during bolus administration of intravenous contrast. Multiplanar CT image reconstructions and MIPs were obtained to evaluate the vascular anatomy. CONTRAST:  160m ISOVUE-370 IOPAMIDOL (ISOVUE-370) INJECTION 76% COMPARISON:  09/23/2017 chest radiograph FINDINGS: Cardiovascular: Satisfactory opacification of the pulmonary arteries. Mild respiratory artifact. Dense contrast in the SVC with streak artifact suprahilar region. No pulmonary embolus identified. Normal heart size. Normal caliber thoracic aorta and main pulmonary artery. No pericardial effusion. Mediastinum/Nodes: Diffuse prominence of pulmonary lymph nodes measuring up to 14 x 13 mm in the left hilum (series 5, image 137). Calcified right-sided hilar lymph nodes. Mildly patulous thoracic esophagus. Normal thyroid gland. Lungs/Pleura: Diffuse patchy ground-glass opacities with dense consolidation in the left upper lobe and left lower lobes compatible with multifocal pneumonia. Moderate centrilobular emphysema. No pleural effusion or pneumothorax. Upper Abdomen: Partially visualized splenomegaly. Musculoskeletal: Chronic right fifth anterolateral rib fracture. No acute osseous abnormality identified. Review of the MIP images confirms the above findings. IMPRESSION: 1. No pulmonary embolus identified. 2. Multifocal pneumonia. No cavitation. Mediastinal lymph nodes greatest in left hilar region, likely reactive. Follow-up to resolution recommended to exclude underlying malignancy. 3. Moderate centrilobular emphysema. 4. Partially visualized splenomegaly. Electronically Signed   By: LKristine GarbeM.D.   On: 09/23/2017 06:14   Dg Chest Portable 1 View  Result Date: 09/23/2017 CLINICAL DATA:   57y/o  M; chest pain and shortness of breath. EXAM: PORTABLE CHEST 1 VIEW COMPARISON:  08/16/2017 chest radiograph. FINDINGS: Normal cardiac silhouette. Aortic atherosclerosis with calcification. Left mid and lower lung zone consolidation. No pleural effusion or pneumothorax. No acute osseous abnormality is evident. IMPRESSION: Left mid and lower lung zone consolidation likely representing pneumonia. Electronically Signed   By: LKristine GarbeM.D.   On: 09/23/2017 00:56    Pending Labs Unresulted Labs (From admission, onward)   None      Vitals/Pain Today's Vitals   09/23/17 0530 09/23/17 0650 09/23/17 0655 09/23/17 0659  BP: 140/61 (!) 119/49 (!) 157/103   Pulse: 72 71 80   Resp: '20 20 19   ' Temp:   97.8 F (36.6 C)   TempSrc:   Oral   SpO2: 93% 93% 97%   Weight:   185 lb (83.9 kg)   Height:   '6\' 1"'  (1.854 m)   PainSc:   7  7     Isolation Precautions No active isolations  Medications Medications  aspirin chewable tablet 324 mg (324 mg Oral Not Given 09/23/17 0052)  morphine 4 MG/ML injection 4 mg (4 mg Intravenous Given 09/23/17 0624)  sodium chloride 0.9 % injection (not administered)  albuterol (PROVENTIL) (2.5 MG/3ML) 0.083% nebulizer solution 2.5 mg (not administered)  ipratropium-albuterol (DUONEB) 0.5-2.5 (3) MG/3ML nebulizer solution 3 mL (not administered)  acetaminophen (TYLENOL) tablet 1,000 mg (not administered)  sodium chloride 0.9 % bolus 1,000 mL (not administered)  morphine 4 MG/ML injection 4 mg (4 mg Intravenous Given 09/23/17 0053)  sodium chloride 0.9 % bolus 1,000 mL (0 mLs Intravenous Stopped 09/23/17 0207)  ceFEPIme (MAXIPIME) 2 g in sodium chloride 0.9 % 100 mL IVPB (0 g Intravenous Stopped 09/23/17 0238)  vancomycin (VANCOCIN) IVPB 1000 mg/200 mL premix (0 mg Intravenous Stopped 09/23/17 0342)  hydrocortisone sodium succinate (SOLU-CORTEF) 100 MG injection 200 mg (200 mg Intravenous Given 09/23/17 0239)  diphenhydrAMINE (BENADRYL) capsule 50 mg ( Oral  See Alternative 09/23/17 0504)    Or  diphenhydrAMINE (BENADRYL) injection 50 mg (50 mg Intravenous Given 09/23/17 0504)  iopamidol (ISOVUE-370)  76 % injection 100 mL (100 mLs Intravenous Contrast Given 09/23/17 0548)    Mobility walks

## 2017-09-23 NOTE — Progress Notes (Signed)
A consult was received from an ED physician for vancomycin per pharmacy dosing.  The patient's profile has been reviewed for ht/wt/allergies/indication/available labs.   A one time order has been placed for vancomycin 1gm iv x1.  Further antibiotics/pharmacy consults should be ordered by admitting physician if indicated.                       Thank you, Aleene DavidsonGrimsley Jr, [place] Silveria Crowford 09/23/2017  1:13 AM

## 2017-09-24 DIAGNOSIS — I10 Essential (primary) hypertension: Secondary | ICD-10-CM | POA: Diagnosis present

## 2017-09-24 DIAGNOSIS — E876 Hypokalemia: Secondary | ICD-10-CM | POA: Diagnosis present

## 2017-09-24 DIAGNOSIS — N400 Enlarged prostate without lower urinary tract symptoms: Secondary | ICD-10-CM | POA: Diagnosis present

## 2017-09-24 DIAGNOSIS — J181 Lobar pneumonia, unspecified organism: Secondary | ICD-10-CM | POA: Diagnosis present

## 2017-09-24 DIAGNOSIS — R7989 Other specified abnormal findings of blood chemistry: Secondary | ICD-10-CM | POA: Diagnosis present

## 2017-09-24 DIAGNOSIS — I2510 Atherosclerotic heart disease of native coronary artery without angina pectoris: Secondary | ICD-10-CM | POA: Diagnosis present

## 2017-09-24 DIAGNOSIS — I5189 Other ill-defined heart diseases: Secondary | ICD-10-CM | POA: Diagnosis present

## 2017-09-24 DIAGNOSIS — I252 Old myocardial infarction: Secondary | ICD-10-CM | POA: Diagnosis not present

## 2017-09-24 DIAGNOSIS — K7581 Nonalcoholic steatohepatitis (NASH): Secondary | ICD-10-CM | POA: Diagnosis present

## 2017-09-24 DIAGNOSIS — A419 Sepsis, unspecified organism: Secondary | ICD-10-CM | POA: Diagnosis present

## 2017-09-24 DIAGNOSIS — F329 Major depressive disorder, single episode, unspecified: Secondary | ICD-10-CM

## 2017-09-24 DIAGNOSIS — J441 Chronic obstructive pulmonary disease with (acute) exacerbation: Secondary | ICD-10-CM | POA: Diagnosis present

## 2017-09-24 DIAGNOSIS — E872 Acidosis: Secondary | ICD-10-CM | POA: Diagnosis present

## 2017-09-24 DIAGNOSIS — D649 Anemia, unspecified: Secondary | ICD-10-CM | POA: Diagnosis present

## 2017-09-24 DIAGNOSIS — Z7982 Long term (current) use of aspirin: Secondary | ICD-10-CM | POA: Diagnosis not present

## 2017-09-24 DIAGNOSIS — R748 Abnormal levels of other serum enzymes: Secondary | ICD-10-CM | POA: Diagnosis present

## 2017-09-24 DIAGNOSIS — J189 Pneumonia, unspecified organism: Secondary | ICD-10-CM | POA: Diagnosis not present

## 2017-09-24 DIAGNOSIS — I739 Peripheral vascular disease, unspecified: Secondary | ICD-10-CM | POA: Diagnosis present

## 2017-09-24 DIAGNOSIS — J9601 Acute respiratory failure with hypoxia: Secondary | ICD-10-CM | POA: Diagnosis present

## 2017-09-24 DIAGNOSIS — F17210 Nicotine dependence, cigarettes, uncomplicated: Secondary | ICD-10-CM | POA: Diagnosis present

## 2017-09-24 DIAGNOSIS — J440 Chronic obstructive pulmonary disease with acute lower respiratory infection: Secondary | ICD-10-CM | POA: Diagnosis present

## 2017-09-24 DIAGNOSIS — Z79899 Other long term (current) drug therapy: Secondary | ICD-10-CM | POA: Diagnosis not present

## 2017-09-24 DIAGNOSIS — F419 Anxiety disorder, unspecified: Secondary | ICD-10-CM | POA: Diagnosis present

## 2017-09-24 DIAGNOSIS — Z888 Allergy status to other drugs, medicaments and biological substances status: Secondary | ICD-10-CM | POA: Diagnosis not present

## 2017-09-24 DIAGNOSIS — B182 Chronic viral hepatitis C: Secondary | ICD-10-CM | POA: Diagnosis present

## 2017-09-24 DIAGNOSIS — Z91041 Radiographic dye allergy status: Secondary | ICD-10-CM | POA: Diagnosis not present

## 2017-09-24 LAB — CBC
HCT: 36.8 % — ABNORMAL LOW (ref 39.0–52.0)
HEMOGLOBIN: 12.2 g/dL — AB (ref 13.0–17.0)
MCH: 27.1 pg (ref 26.0–34.0)
MCHC: 33.2 g/dL (ref 30.0–36.0)
MCV: 81.8 fL (ref 78.0–100.0)
PLATELETS: 147 10*3/uL — AB (ref 150–400)
RBC: 4.5 MIL/uL (ref 4.22–5.81)
RDW: 16.4 % — AB (ref 11.5–15.5)
WBC: 4.1 10*3/uL (ref 4.0–10.5)

## 2017-09-24 LAB — BASIC METABOLIC PANEL
ANION GAP: 9 (ref 5–15)
BUN: 10 mg/dL (ref 6–20)
CALCIUM: 8.3 mg/dL — AB (ref 8.9–10.3)
CO2: 21 mmol/L — ABNORMAL LOW (ref 22–32)
CREATININE: 0.74 mg/dL (ref 0.61–1.24)
Chloride: 108 mmol/L (ref 101–111)
GFR calc Af Amer: >60 mL/min (ref >60)
Glucose, Bld: 122 mg/dL — ABNORMAL HIGH (ref 65–99)
Potassium: 3 mmol/L — ABNORMAL LOW (ref 3.5–5.1)
Sodium: 138 mmol/L (ref 135–145)

## 2017-09-24 LAB — HIV ANTIBODY (ROUTINE TESTING W REFLEX): HIV SCREEN 4TH GENERATION: NONREACTIVE

## 2017-09-24 MED ORDER — IPRATROPIUM-ALBUTEROL 0.5-2.5 (3) MG/3ML IN SOLN
3.0000 mL | Freq: Four times a day (QID) | RESPIRATORY_TRACT | Status: DC
  Administered 2017-09-24 – 2017-09-26 (×10): 3 mL via RESPIRATORY_TRACT
  Filled 2017-09-24 (×10): qty 3

## 2017-09-24 MED ORDER — POTASSIUM CHLORIDE CRYS ER 20 MEQ PO TBCR
40.0000 meq | EXTENDED_RELEASE_TABLET | ORAL | Status: AC
  Administered 2017-09-24 (×2): 40 meq via ORAL
  Filled 2017-09-24 (×2): qty 2

## 2017-09-24 MED ORDER — METHYLPREDNISOLONE SODIUM SUCC 40 MG IJ SOLR
40.0000 mg | Freq: Every day | INTRAMUSCULAR | Status: DC
  Administered 2017-09-24 – 2017-09-25 (×2): 40 mg via INTRAVENOUS
  Filled 2017-09-24 (×2): qty 1

## 2017-09-24 MED ORDER — AZITHROMYCIN 250 MG PO TABS
500.0000 mg | ORAL_TABLET | Freq: Every day | ORAL | Status: DC
  Administered 2017-09-24 – 2017-09-27 (×4): 500 mg via ORAL
  Filled 2017-09-24 (×4): qty 2

## 2017-09-24 MED ORDER — GUAIFENESIN-DM 100-10 MG/5ML PO SYRP
5.0000 mL | ORAL_SOLUTION | ORAL | Status: DC | PRN
  Administered 2017-09-24 – 2017-09-25 (×3): 5 mL via ORAL
  Filled 2017-09-24 (×3): qty 10

## 2017-09-24 NOTE — Progress Notes (Addendum)
Progress Note   Edward Little ZOX:[id number] DOB: 01/15/1961 DOA: 09/23/2017 PCP: [name], MD   LOS: 0 days    Brief Narrative:  Edward Little is a 57 year old male with a medical history significant for PVD, previous MI, substance abuse, BPH, HTN, and depression/anxiety who presented to the ED on 09/23/2017 due to worsening pleuritic chest pain. Upon presentation to the ED, patient was febrile at 101.4, HR 97, RR 14. Initial labs demonstrated Na 133, Chloride 98, glucose 107, Ca 8.6, Albumin 2.4, AST 82, T. Bilirubin 2.4, Hgb 12.1, HCT 34.8, RDW 15.9. Lactic acid levels were 2.16, D-dimer 2.96, and PT 15.5. Troponin levels were elevated at 0.07. BNP 2157. CXR demonstrated left mid and left lower lobe consolidation. CTA was negative for PE and aortic dissection with mediastinal lymph nodes greatest in left hilar region with splenomegaly. EKG showed sinus rhythm, left atrial enlargement, probable left ventricular enlargement, and nonspecific T abnormalities. Patient was given 1L IVFs, vancomycin, cefepime, 200mg  hydrocortisone, and Duoneb breathing treatment in the ED.  Patient was admitted on the working diagnosis of acute respiratory failure with hypoxia due to multifocal pneumonia.  Assessment/Plan:   Principal Problem:   CAP (community acquired pneumonia) Active Problems:   MDD (major depressive disorder), severe (HCC)   Essential hypertension   Elevated troponin   BPH (benign prostatic hyperplasia)  1. Acute respiratory failure with hypoxia due to CAP: Patient is oxygenating better on room air. Patient currently has a SpO2 of 95% on room air.  -Continue Rocephin, but switch Doxycycline to Azithromycin.  -Continue DUONEB q 6 hrs and albuterol nebulizer q 2 hours prn for wheezing and SOB -Continue Tessalon prn 3 times daily for cough -Continue to monitor O2 levels. Initiate nasal cannula 2L if <92% -start SOLU-MEDROL 40mg  -morphine prn q 2 hours for pain -Continue oral  hydration  2. COPD exacerbation: Patient has been previously diagnosed with COPD. CTA demonstrated moderate central emphysema.  -Continue treatment as noted above  3. Hypertension: HTN is still elevated at 167/77 today. Continue metoprolol. Hydralazine prn if SBP >180 or DBP >100.  4. Tobacco Abuse: Continue Nicoderm CQ q 24 hours. Smoking cessation counseling given to patient.   5. Depression/ Anxiety: Continue Lexapro and Seroquel.   6. Elevated troponin: Troponin levels are trending down and are currently 0.04.   7. CAD: Continue ASA.   8. BPH: Continue Flomax.  9. Hypokalemia: Patient's potassium level today was 3.0. Replete K levels with po x 2 doses. Obtain BMP tomorrow morning to check potassium levels.     Family Communication/Anticipated D/C date and plan/Code Status   DVT prophylaxis: Full Code Code Status: Lovenox Family Communication: No family at bedside Disposition Plan: Home   Medical Consultants:      Antimicrobials:  Rocephin 1g in NaCl 0.9% 100 mL IVPB at 123mL/hr (1) Doxycycline 100 mg in NaCl 0.9% IVPB at 12mL/hr BID (1)   Subjective:  Patient explains that his pain has gotten slightly better over the past 24 hours, but still rates it a 7/10, mostly during inspiration. He is also experiencing pain in his left groin area. He admits to SOB, especially with ambulation, inspiratory pleuritic chest pain, nausea, and productive cough with clear phlegm. He also states he feels feverish with chills. He denies vomiting and diarrhea.    Objective:   Vitals:   09/23/17 2022 09/23/17 2102 09/24/17 0615 09/24/17 0814  BP:  (!) 162/68 (!) 167/77   Pulse:  69 73  Resp:  17 17   Temp:  (!) 97.4 F (36.3 C) 97.6 F (36.4 C)   TempSrc:  Oral Oral   SpO2: 95% 97% 97% 95%  Weight:   84.5 kg (186 lb 4.6 oz)   Height:        Intake/Output Summary (Last 24 hours) at 09/24/2017 0939 Last data filed at 09/24/2017 4098 Gross per 24 hour  Intake  3838.33 ml  Output -  Net 3838.33 ml   Filed Weights   28-Sep-2017 0807 2017-09-28 0834 09/24/17 0615  Weight: 81.9 kg (180 lb 9.6 oz) 83.1 kg (183 lb 3.2 oz) 84.5 kg (186 lb 4.6 oz)     Physical Exam:   Constitutional: NAD, AAO x3 Eyes: lids and conjunctivae normal ENMT: Mucous membranes are moist Neck: normal, supple, no masses Respiratory: Late-expiratory wheezing, no crackles or rhonchi. Normal respiratory effort. No accessory muscle use.  Cardiovascular: Regular rate and rhythm, normal S1-S2, no murmurs / rubs / gallops. No LE edema.  Abdomen: soft, nontender and nondistended. LLQ scar from AAA repair appears well healed with no signs of infection Musculoskeletal: no clubbing / cyanosis. No joint deformity upper and lower extremities. Normal muscle tone.  Skin: no rashes, lesions, ulcers.  Neurologic: non-focal Psychiatric: Alert and oriented x 3    Data Reviewed:   I have independently reviewed following labs and imaging studies:   CBC: Recent Labs  Lab 2017-09-28 0033 28-Sep-2017 0843 09/24/17 0841  WBC 8.7 5.3 4.1  NEUTROABS 6.5  --   --   HGB 12.1* 11.2* 12.2*  HCT 34.8* 32.9* 36.8*  MCV 78.9 79.5 81.8  PLT 169 139* 147*   Basic Metabolic Panel: Recent Labs  Lab 2017-09-28 0033 28-Sep-2017 0843 09/24/17 0841  NA 133*  --  138  K 4.1  --  3.0*  CL 98*  --  108  CO2 25  --  21*  GLUCOSE 107*  --  122*  BUN 11  --  10  CREATININE 0.72 0.63 0.74  CALCIUM 8.6*  --  8.3*  MG 1.8  --   --    GFR: Estimated Creatinine Clearance: 116.5 mL/min (by C-G formula based on SCr of 0.74 mg/dL). Liver Function Tests: Recent Labs  Lab Sep 28, 2017 0033  AST 82*  ALT 39  ALKPHOS 76  BILITOT 2.4*  PROT 6.8  ALBUMIN 2.4*   No results for input(s): LIPASE, AMYLASE in the last 168 hours. No results for input(s): AMMONIA in the last 168 hours. Coagulation Profile: Recent Labs  Lab 28-Sep-2017 0033  INR 1.24   Cardiac Enzymes: Recent Labs  Lab 09-28-2017 0033 2017/09/28 0843   TROPONINI 0.07* 0.04*   BNP (last 3 results) No results for input(s): PROBNP in the last 8760 hours. HbA1C: No results for input(s): HGBA1C in the last 72 hours. CBG: Recent Labs  Lab 09/28/17 0033  GLUCAP 115*   Lipid Profile: No results for input(s): CHOL, HDL, LDLCALC, TRIG, CHOLHDL, LDLDIRECT in the last 72 hours. Thyroid Function Tests: No results for input(s): TSH, T4TOTAL, FREET4, T3FREE, THYROIDAB in the last 72 hours. Anemia Panel: No results for input(s): VITAMINB12, FOLATE, FERRITIN, TIBC, IRON, RETICCTPCT in the last 72 hours. Urine analysis:    Component Value Date/Time   BILIRUBINUR negative 06/26/2017 1600   KETONESUR negative 06/26/2017 1600   PROTEINUR =30 (A) 06/26/2017 1600   UROBILINOGEN 1.0 06/26/2017 1600   NITRITE Negative 06/26/2017 1600   LEUKOCYTESUR Small (1+) (A) 06/26/2017 1600   Sepsis Labs: Invalid input(s): PROCALCITONIN, LACTICIDVEN  No results found for this or any previous visit (from the past 240 hour(s)).    Radiology Studies: Ct Angio Chest Pe W/cm &/or Wo Cm  Result Date: 09/23/2017 CLINICAL DATA:  57 y/o M; fever and chest pain. Elevated D-dimer. Productive cough with green sputum. EXAM: CT ANGIOGRAPHY CHEST WITH CONTRAST TECHNIQUE: Multidetector CT imaging of the chest was performed using the standard protocol during bolus administration of intravenous contrast. Multiplanar CT image reconstructions and MIPs were obtained to evaluate the vascular anatomy. CONTRAST:  100mL ISOVUE-370 IOPAMIDOL (ISOVUE-370) INJECTION 76% COMPARISON:  09/23/2017 chest radiograph FINDINGS: Cardiovascular: Satisfactory opacification of the pulmonary arteries. Mild respiratory artifact. Dense contrast in the SVC with streak artifact suprahilar region. No pulmonary embolus identified. Normal heart size. Normal caliber thoracic aorta and main pulmonary artery. No pericardial effusion. Mediastinum/Nodes: Diffuse prominence of pulmonary lymph nodes measuring up to  14 x 13 mm in the left hilum (series 5, image 137). Calcified right-sided hilar lymph nodes. Mildly patulous thoracic esophagus. Normal thyroid gland. Lungs/Pleura: Diffuse patchy ground-glass opacities with dense consolidation in the left upper lobe and left lower lobes compatible with multifocal pneumonia. Moderate centrilobular emphysema. No pleural effusion or pneumothorax. Upper Abdomen: Partially visualized splenomegaly. Musculoskeletal: Chronic right fifth anterolateral rib fracture. No acute osseous abnormality identified. Review of the MIP images confirms the above findings. IMPRESSION: 1. No pulmonary embolus identified. 2. Multifocal pneumonia. No cavitation. Mediastinal lymph nodes greatest in left hilar region, likely reactive. Follow-up to resolution recommended to exclude underlying malignancy. 3. Moderate centrilobular emphysema. 4. Partially visualized splenomegaly. Electronically Signed   By: Mitzi HansenLance  Furusawa-Stratton M.D.   On: 09/23/2017 06:14   Dg Chest Portable 1 View  Result Date: 09/23/2017 CLINICAL DATA:  57 y/o  M; chest pain and shortness of breath. EXAM: PORTABLE CHEST 1 VIEW COMPARISON:  08/16/2017 chest radiograph. FINDINGS: Normal cardiac silhouette. Aortic atherosclerosis with calcification. Left mid and lower lung zone consolidation. No pleural effusion or pneumothorax. No acute osseous abnormality is evident. IMPRESSION: Left mid and lower lung zone consolidation likely representing pneumonia. Electronically Signed   By: Mitzi HansenLance  Furusawa-Stratton M.D.   On: 09/23/2017 00:56      Medication:   . aspirin  324 mg Oral Once  . aspirin  81 mg Oral Daily  . enoxaparin (LOVENOX) injection  40 mg Subcutaneous Q24H  . escitalopram  20 mg Oral Daily  . ipratropium-albuterol  3 mL Nebulization TID  . metoprolol succinate  50 mg Oral Daily  . nicotine  21 mg Transdermal Daily  . QUEtiapine  100 mg Oral QHS  . sodium chloride flush  3 mL Intravenous Q12H  . tamsulosin  0.4 mg  Oral Daily    Continuous Infusions: . cefTRIAXone (ROCEPHIN)  IV Stopped (09/23/17 1712)  . doxycycline (VIBRAMYCIN) IV Stopped (09/23/17 2307)      Time spent: 25 minutes  Signed, Caprice Renshawaroline Cheek, PA-S [employer] 2020 Email: [email or web address]

## 2017-09-24 NOTE — Progress Notes (Signed)
PROGRESS NOTE    Edward Little  ZOX:[id number] DOB: 01/30/1961 DOA: 09/23/2017 PCP: [name], MD    Brief Narrative:  57 year old male who presented to hospital not feeling well and having chest pain.  He does have the significant past medical history of COPD, tobacco abuse, and peripheral vascular disease.  Patient complained of persistent and worsening left-sided chest wall pain for the last 2 days prior to hospitalization, associated with dry cough, fevers and chills.  On the initial physical examination her blood pressure 140/61, heart rate 72,-97 respiratory 20, temperature 101.4 - 97.3 F, oxygen saturation 93%.  Moist mucous membranes, lungs clear to auscultation, positive rales at the left mid lung, no wheezing, heart S1-S2 present and rhythmic, abdomen soft nontender, no lower extremity edema.  Sodium 133, potassium 4.1, chloride 98, bicarb 25, glucose 27, BUN 11, creatinine 0.72, BNP 2157, troponin 0 0.07, white count 8.7, hemoglobin 12.1, hematocrit 34.8, platelets 169.  D-dimer 2.96, INR 1.24, influenza negative.  Chest x-ray with dense left lower and left upper lobe infiltrate, CT chest with dense infiltrate with air bronchograms left lower lobe and left upper lobe.  EKG sinus tachycardia with normal axis, normal intervals, positive LVH.  Patient was admitted to the hospital with the working diagnosis of multilobar left upper lobe, left lower lobe community-acquired pneumonia, complicated by sepsis.   Assessment & Plan:   Principal Problem:   CAP (community acquired pneumonia) Active Problems:   MDD (major depressive disorder), severe (HCC)   Essential hypertension   Elevated troponin   BPH (benign prostatic hyperplasia)  1.  Community-acquired pneumonia complicated by sepsis. Will continue antibiotic therapy with ceftriaxone and azithromycin, continue to follow on cell count, cultures and temperature curve. Patient tolerating well po, will hold on IV fluids to prevent  volume overload.   2.  COPD with acute exacerbation.  Will continue oxymetry monitoring, supplemental 02 per [place] as needed. Aggressive bronchodilator therapy with duoneb, continue systemic steroids 40 mg of methylprednisolone daily. Continue cough suppressive therapy with guaifenesin dm as needed.   3. Hypokalemia. Correct k with kcl, 80 meq in 2 divided doses. Serum K 3.0, renal function preserved with serum cr at 0.74.   3.  Hypertension. Continue blood pressure control with metoprolol.   4.  Tobacco abuse. Smoking cessation counseling, nicotine patch.   5. Depression. Will continue escitalopram and seroquel  DVT prophylaxis: enoxaparin  Code Status: full Family Communication: no family at the bedside  Disposition Plan: home   Consultants:     Procedures:     Antimicrobials:   Ceftriaxone and azithromycin    Subjective: Patient is feeling better but persistent dyspnea and cough, positive pleuritic chest pain on the left, moderate in intensity.   Objective: Vitals:   09/24/17 0615 09/24/17 0814 09/24/17 1436 09/24/17 1457  BP: (!) 167/77   (!) 163/84  Pulse: 73   67  Resp: 17   18  Temp: 97.6 F (36.4 C)   97.8 F (36.6 C)  TempSrc: Oral   Oral  SpO2: 97% 95% 95% 97%  Weight: 84.5 kg (186 lb 4.6 oz)     Height:        Intake/Output Summary (Last 24 hours) at 09/24/2017 1612 Last data filed at 09/24/2017 1345 Gross per 24 hour  Intake 3665 ml  Output -  Net 3665 ml   Filed Weights   09/23/17 0807 09/23/17 0834 09/24/17 0615  Weight: 81.9 kg (180 lb 9.6 oz) 83.1 kg (183 lb 3.2 oz) 84.5  kg (186 lb 4.6 oz)    Examination:   General: Not in pain or dyspnea, deconditioned Neurology: Awake and alert, non focal  E ENT: mild pallor, no icterus, oral mucosa moist Cardiovascular: No JVD. S1-S2 present, rhythmic, no gallops, rubs, or murmurs. No lower extremity edema. Pulmonary: decreased breath sounds bilaterally, decreased air movement, scattered expiratory  wheezing, bilateral rhonchi, mild left lung rales, left lower and left upper lobe. Gastrointestinal. Abdomen flat, no organomegaly, non tender, no rebound or guarding Skin. No rashes Musculoskeletal: no joint deformities     Data Reviewed: I have personally reviewed following labs and imaging studies  CBC: Recent Labs  Lab 09/23/17 0033 09/23/17 0843 09/24/17 0841  WBC 8.7 5.3 4.1  NEUTROABS 6.5  --   --   HGB 12.1* 11.2* 12.2*  HCT 34.8* 32.9* 36.8*  MCV 78.9 79.5 81.8  PLT 169 139* 147*   Basic Metabolic Panel: Recent Labs  Lab 09/23/17 0033 09/23/17 0843 09/24/17 0841  NA 133*  --  138  K 4.1  --  3.0*  CL 98*  --  108  CO2 25  --  21*  GLUCOSE 107*  --  122*  BUN 11  --  10  CREATININE 0.72 0.63 0.74  CALCIUM 8.6*  --  8.3*  MG 1.8  --   --    GFR: Estimated Creatinine Clearance: 116.5 mL/min (by C-G formula based on SCr of 0.74 mg/dL). Liver Function Tests: Recent Labs  Lab 09/23/17 0033  AST 82*  ALT 39  ALKPHOS 76  BILITOT 2.4*  PROT 6.8  ALBUMIN 2.4*   No results for input(s): LIPASE, AMYLASE in the last 168 hours. No results for input(s): AMMONIA in the last 168 hours. Coagulation Profile: Recent Labs  Lab 09/23/17 0033  INR 1.24   Cardiac Enzymes: Recent Labs  Lab 09/23/17 0033 09/23/17 0843  TROPONINI 0.07* 0.04*   BNP (last 3 results) No results for input(s): PROBNP in the last 8760 hours. HbA1C: No results for input(s): HGBA1C in the last 72 hours. CBG: Recent Labs  Lab 09/23/17 0033  GLUCAP 115*   Lipid Profile: No results for input(s): CHOL, HDL, LDLCALC, TRIG, CHOLHDL, LDLDIRECT in the last 72 hours. Thyroid Function Tests: No results for input(s): TSH, T4TOTAL, FREET4, T3FREE, THYROIDAB in the last 72 hours. Anemia Panel: No results for input(s): VITAMINB12, FOLATE, FERRITIN, TIBC, IRON, RETICCTPCT in the last 72 hours.    Radiology Studies: I have reviewed all of the imaging during this hospital visit  personally     Scheduled Meds: . aspirin  324 mg Oral Once  . aspirin  81 mg Oral Daily  . azithromycin  500 mg Oral Daily  . enoxaparin (LOVENOX) injection  40 mg Subcutaneous Q24H  . escitalopram  20 mg Oral Daily  . ipratropium-albuterol  3 mL Nebulization Q6H  . methylPREDNISolone (SOLU-MEDROL) injection  40 mg Intravenous Daily  . metoprolol succinate  50 mg Oral Daily  . nicotine  21 mg Transdermal Daily  . potassium chloride  40 mEq Oral Q4H  . QUEtiapine  100 mg Oral QHS  . sodium chloride flush  3 mL Intravenous Q12H  . tamsulosin  0.4 mg Oral Daily   Continuous Infusions: . cefTRIAXone (ROCEPHIN)  IV Stopped (09/24/17 1112)     LOS: 0 days        Aleja Yearwood Annett Gulaaniel Aashish Hamm, MD [facility] Pager [phone]

## 2017-09-25 LAB — BASIC METABOLIC PANEL
Anion gap: 9 (ref 5–15)
BUN: 11 mg/dL (ref 6–20)
CALCIUM: 8.4 mg/dL — AB (ref 8.9–10.3)
CO2: 20 mmol/L — AB (ref 22–32)
Chloride: 108 mmol/L (ref 101–111)
Creatinine, Ser: 0.59 mg/dL — ABNORMAL LOW (ref 0.61–1.24)
GFR calc non Af Amer: >60 mL/min (ref >60)
GLUCOSE: 142 mg/dL — AB (ref 65–99)
Potassium: 4 mmol/L (ref 3.5–5.1)
Sodium: 137 mmol/L (ref 135–145)

## 2017-09-25 LAB — CBC WITH DIFFERENTIAL/PLATELET
BASOS PCT: 0 %
Basophils Absolute: 0 10*3/uL (ref 0.0–0.1)
Eosinophils Absolute: 0 10*3/uL (ref 0.0–0.7)
Eosinophils Relative: 0 %
HEMATOCRIT: 32.4 % — AB (ref 39.0–52.0)
Hemoglobin: 10.5 g/dL — ABNORMAL LOW (ref 13.0–17.0)
Lymphocytes Relative: 12 %
Lymphs Abs: 0.5 10*3/uL — ABNORMAL LOW (ref 0.7–4.0)
MCH: 26.5 pg (ref 26.0–34.0)
MCHC: 32.4 g/dL (ref 30.0–36.0)
MCV: 81.8 fL (ref 78.0–100.0)
Monocytes Absolute: 0.1 10*3/uL (ref 0.1–1.0)
Monocytes Relative: 3 %
NEUTROS ABS: 3.7 10*3/uL (ref 1.7–7.7)
Neutrophils Relative %: 85 %
Platelets: 164 10*3/uL (ref 150–400)
RBC: 3.96 MIL/uL — ABNORMAL LOW (ref 4.22–5.81)
RDW: 16.6 % — AB (ref 11.5–15.5)
WBC: 4.3 10*3/uL (ref 4.0–10.5)

## 2017-09-25 MED ORDER — MORPHINE SULFATE (PF) 4 MG/ML IV SOLN
4.0000 mg | INTRAVENOUS | Status: DC | PRN
  Administered 2017-09-25 – 2017-09-26 (×5): 4 mg via INTRAVENOUS
  Filled 2017-09-25 (×4): qty 1

## 2017-09-25 MED ORDER — POLYETHYLENE GLYCOL 3350 17 G PO PACK
17.0000 g | PACK | Freq: Every day | ORAL | Status: DC
  Administered 2017-09-25 – 2017-09-27 (×3): 17 g via ORAL
  Filled 2017-09-25 (×3): qty 1

## 2017-09-25 MED ORDER — GUAIFENESIN-DM 100-10 MG/5ML PO SYRP
5.0000 mL | ORAL_SOLUTION | Freq: Four times a day (QID) | ORAL | Status: DC
  Administered 2017-09-25 – 2017-09-27 (×8): 5 mL via ORAL
  Filled 2017-09-25 (×9): qty 10

## 2017-09-25 MED ORDER — IPRATROPIUM-ALBUTEROL 0.5-2.5 (3) MG/3ML IN SOLN: 3.0000 mL | RESPIRATORY_TRACT | Status: DC | PRN

## 2017-09-25 MED ORDER — IBUPROFEN 200 MG PO TABS
400.0000 mg | ORAL_TABLET | Freq: Three times a day (TID) | ORAL | Status: DC
  Administered 2017-09-25 – 2017-09-27 (×7): 400 mg via ORAL
  Filled 2017-09-25 (×7): qty 2

## 2017-09-25 MED ORDER — FLUTICASONE FUROATE-VILANTEROL 200-25 MCG/INH IN AEPB
1.0000 | INHALATION_SPRAY | Freq: Every day | RESPIRATORY_TRACT | Status: DC
  Administered 2017-09-26 – 2017-09-27 (×2): 1 via RESPIRATORY_TRACT
  Filled 2017-09-25: qty 28

## 2017-09-25 NOTE — Progress Notes (Signed)
PT demonstrated hands on understanding of Flutter device. PC at this time. 

## 2017-09-25 NOTE — Progress Notes (Signed)
PROGRESS NOTE    Edward Little  ZOX:[id number] DOB: 02/21/1961 DOA: 09/23/2017 PCP: Myles LippsSantiago, Irma M, MD    Brief Narrative:  57 year old male who presented to hospital not feeling well and having chest pain.  He does have the significant past medical history of COPD, tobacco abuse, and peripheral vascular disease.  Patient complained of persistent and worsening left-sided chest wall pain for the last 2 days prior to hospitalization, associated with dry cough, fevers and chills.  On the initial physical examination her blood pressure 140/61, heart rate 72,-97 respiratory 20, temperature 101.4 - 97.3 F, oxygen saturation 93%.  Moist mucous membranes, lungs clear to auscultation, positive rales at the left mid lung, no wheezing, heart S1-S2 present and rhythmic, abdomen soft nontender, no lower extremity edema.  Sodium 133, potassium 4.1, chloride 98, bicarb 25, glucose 27, BUN 11, creatinine 0.72, BNP 2157, troponin 0 0.07, white count 8.7, hemoglobin 12.1, hematocrit 34.8, platelets 169.  D-dimer 2.96, INR 1.24, influenza negative.  Chest x-ray with dense left lower and left upper lobe infiltrate, CT chest with dense infiltrate with air bronchograms left lower lobe and left upper lobe.  EKG sinus tachycardia with normal axis, normal intervals, positive LVH.  Patient was admitted to the hospital with the working diagnosis of multilobar left upper lobe, left lower lobe community-acquired pneumonia, complicated by sepsis.   Assessment & Plan:   Principal Problem:   CAP (community acquired pneumonia) Active Problems:   MDD (major depressive disorder), severe (HCC)   Essential hypertension   Elevated troponin   BPH (benign prostatic hyperplasia)  1.  Community-acquired pneumonia complicated by sepsis. Antibiotic therapy with ceftriaxone and azithromycin #2, wbc at 4,3, cultures no growth, patient has been afebrile. Will add antitussive therapy with scheduled guaifenesin dm, infiltrate close to the  pleural, provoking significant chest pain, will start patient on tid ibuprofen, continue as needed IV morphine q 4 hours. Out of bed as tolerated.    2.  COPD with acute exacerbation.  Oxymetry monitoring, and supplemental 02 per [place] as needed. Continue with aggressive bronchodilator therapy with duoneb,  systemic steroids 40 mg of methylprednisolone daily. Smoking cessation. Continue Breo.   3. Hypokalemia. Corrected electrolytes, will continue close follow up on renal function and electrolytes. Patient is tolerating po well.  3.  Hypertension. Continue  Metoprolol for blood pressure control.   4.  Tobacco abuse. Continue smoking cessation, nicotine patch.   5. Depression. On scitalopram and seroquel  DVT prophylaxis: enoxaparin  Code Status: full Family Communication: no family at the bedside  Disposition Plan: home   Consultants:     Procedures:     Antimicrobials:   Ceftriaxone and azithromycin    Subjective: Patient with persistent pleuritic chest pain, using IV morphine, persistent dry cough, dyspnea slowly improving. No nausea or vomiting.   Objective: Vitals:   09/25/17 0312 09/25/17 0428 09/25/17 0500 09/25/17 0819  BP:  (!) 163/81    Pulse:  69    Resp:  (!) 21    Temp:  (!) 97.4 F (36.3 C)    TempSrc:  Oral    SpO2: 95% 96%  95%  Weight:   84.4 kg (186 lb 1.1 oz)   Height:        Intake/Output Summary (Last 24 hours) at 09/25/2017 1212 Last data filed at 09/24/2017 1803 Gross per 24 hour  Intake 600 ml  Output -  Net 600 ml   Filed Weights   09/23/17 0834 09/24/17 0615 09/25/17 0500  Weight: 83.1  kg (183 lb 3.2 oz) 84.5 kg (186 lb 4.6 oz) 84.4 kg (186 lb 1.1 oz)    Examination:   General: Not in pain or dyspnea, deconditioned Neurology: Awake and alert, non focal  E ENT: mild pallor, no icterus, oral mucosa moist Cardiovascular: No JVD. S1-S2 present, rhythmic, no gallops, rubs, or murmurs. No lower extremity edema. Pulmonary:  decreased breath sounds bilaterally, prolonged expiratory phase, no wheezing, diffuse bilateral rhonchi and left lower lobe rales. Gastrointestinal. Abdomen flat, no organomegaly, non tender, no rebound or guarding Skin. No rashes Musculoskeletal: no joint deformities     Data Reviewed: I have personally reviewed following labs and imaging studies  CBC: Recent Labs  Lab 09/23/17 0033 09/23/17 0843 09/24/17 0841 09/25/17 0628  WBC 8.7 5.3 4.1 4.3  NEUTROABS 6.5  --   --  3.7  HGB 12.1* 11.2* 12.2* 10.5*  HCT 34.8* 32.9* 36.8* 32.4*  MCV 78.9 79.5 81.8 81.8  PLT 169 139* 147* 164   Basic Metabolic Panel: Recent Labs  Lab 09/23/17 0033 09/23/17 0843 09/24/17 0841 09/25/17 0628  NA 133*  --  138 137  K 4.1  --  3.0* 4.0  CL 98*  --  108 108  CO2 25  --  21* 20*  GLUCOSE 107*  --  122* 142*  BUN 11  --  10 11  CREATININE 0.72 0.63 0.74 0.59*  CALCIUM 8.6*  --  8.3* 8.4*  MG 1.8  --   --   --    GFR: Estimated Creatinine Clearance: 116.5 mL/min (A) (by C-G formula based on SCr of 0.59 mg/dL (L)). Liver Function Tests: Recent Labs  Lab 09/23/17 0033  AST 82*  ALT 39  ALKPHOS 76  BILITOT 2.4*  PROT 6.8  ALBUMIN 2.4*   No results for input(s): LIPASE, AMYLASE in the last 168 hours. No results for input(s): AMMONIA in the last 168 hours. Coagulation Profile: Recent Labs  Lab 09/23/17 0033  INR 1.24   Cardiac Enzymes: Recent Labs  Lab 09/23/17 0033 09/23/17 0843  TROPONINI 0.07* 0.04*   BNP (last 3 results) No results for input(s): PROBNP in the last 8760 hours. HbA1C: No results for input(s): HGBA1C in the last 72 hours. CBG: Recent Labs  Lab 09/23/17 0033  GLUCAP 115*   Lipid Profile: No results for input(s): CHOL, HDL, LDLCALC, TRIG, CHOLHDL, LDLDIRECT in the last 72 hours. Thyroid Function Tests: No results for input(s): TSH, T4TOTAL, FREET4, T3FREE, THYROIDAB in the last 72 hours. Anemia Panel: No results for input(s): VITAMINB12, FOLATE,  FERRITIN, TIBC, IRON, RETICCTPCT in the last 72 hours.    Radiology Studies: I have reviewed all of the imaging during this hospital visit personally     Scheduled Meds: . aspirin  324 mg Oral Once  . aspirin  81 mg Oral Daily  . azithromycin  500 mg Oral Daily  . enoxaparin (LOVENOX) injection  40 mg Subcutaneous Q24H  . escitalopram  20 mg Oral Daily  . fluticasone furoate-vilanterol  1 puff Inhalation Daily  . guaiFENesin-dextromethorphan  5 mL Oral Q6H  . ibuprofen  400 mg Oral TID  . ipratropium-albuterol  3 mL Nebulization Q6H  . methylPREDNISolone (SOLU-MEDROL) injection  40 mg Intravenous Daily  . metoprolol succinate  50 mg Oral Daily  . nicotine  21 mg Transdermal Daily  . polyethylene glycol  17 g Oral Daily  . QUEtiapine  100 mg Oral QHS  . sodium chloride flush  3 mL Intravenous Q12H  . tamsulosin  0.4 mg Oral Daily   Continuous Infusions: . cefTRIAXone (ROCEPHIN)  IV 1 g (09/25/17 1052)     LOS: 1 day        Mauricio Annett Gula, MD [facility] Pager [phone]

## 2017-09-25 NOTE — Progress Notes (Addendum)
Progress Note   Edward Little:[id number] DOB: 1960-08-12 DOA: 09/23/2017 PCP: Edward Lipps, MD   LOS: 1 day    Brief Narrative:  Edward Little is a 57 year old male with a medical history significant for PVD, previous MI, substance abuse, BPH, HTN, and depression/anxiety who presented to the ED on 09/23/2017 due to worsening pleuritic chest pain. Upon presentation to the ED, patient was febrile at 101.4, HR 97, RR 14. Initial labs demonstrated Na 133, Chloride 98, glucose 107, Ca 8.6, Albumin 2.4, AST 82, T. Bilirubin 2.4, Hgb 12.1, HCT 34.8, RDW 15.9. Lactic acid levels were 2.16, D-dimer 2.96, and PT 15.5. Troponin levels were elevated at 0.07. BNP 2157. CXR demonstrated left mid and left lower lobe consolidation. CTA was negative for PE and aortic dissection with mediastinal lymph nodes greatest in left hilar region with splenomegaly. EKG showed sinus rhythm, left atrial enlargement, probable left ventricular enlargement, and nonspecific T abnormalities. Patient was given 1L IVFs, vancomycin, cefepime, 200mg  hydrocortisone, and Duoneb breathing treatment in the ED.  Patient was admitted on the working diagnosis of acute respiratory failure with hypoxia due to multifocal pneumonia complicated by sepsis.  Assessment/Plan:   Principal Problem:   CAP (community acquired pneumonia) Active Problems:   MDD (major depressive disorder), severe (HCC)   Essential hypertension   Elevated troponin   BPH (benign prostatic hyperplasia)  1. Acute respiratory failure with hypoxia due to community acquired pneumonia complicated by sepsis: Patient is oxygenating well on room air. Patient currently has a SpO2 of 95% on room air. Continue antibiotic treatment for community acquired pneumonia and Solu-medrol 40mg . Switch pain medication to scheduled ibuprofen 400mg  TID and morphine 4mg  injection q 4 hours. Continue DUONEB but switch to q 2 hrs for wheezing and SOB. Switch Robitussin to scheduled 5 mL q 6  hours to control cough and pain. Continue to monitor O2 levels. Initiate nasal cannula 2L if <92%. Patient tolerating po hydration well. Obtain BMP and CBC with differential/platelet tomorrow am.   2. COPD with exacerbation: Patient has been previously diagnosed with COPD. CTA demonstrated moderate central emphysema. Continue treatment as noted above and start BREO to prevent future COPD exacerbation. Patient should follow-up with PCP after discharge to continue appropriate COPD medication. Continue steroid treatment and bronchodilator.   3. Hypertension: HTN is still elevated at 163/81 today. Continue metoprolol. Hydralazine prn if SBP >180 or DBP >100.  4. Tobacco Abuse: Continue Nicoderm CQ q 24 hours. Smoking cessation counseling given to patient.   5. Depression/ Anxiety: Patient is not agitated or aggressive. Continue Lexapro and Seroquel.    Family Communication/Anticipated D/C date and plan/Code Status   DVT prophylaxis: Enoxaparin Code Status: Full Family Communication: No family at bedside Disposition Plan: Home   Medical Consultants:      Antimicrobials:  Azithromycin 500mg  (2) Rocephin 1 g (3)   Subjective:  Patient admits to having a bad night last night with a severe productive cough. He states that when he would have cough attacks he would feel short of breath which caused great anxiety. He feels that his wheezing has worsened with SOB during ambulation, especially when taking showers. He is still experiencing inspiratory pleuritic pain.   Objective:   Vitals:   09/25/17 0312 09/25/17 0428 09/25/17 0500 09/25/17 0819  BP:  (!) 163/81    Pulse:  69    Resp:  (!) 21    Temp:  (!) 97.4 F (36.3 C)    TempSrc:  Oral  SpO2: 95% 96%  95%  Weight:   84.4 kg (186 lb 1.1 oz)   Height:        Intake/Output Summary (Last 24 hours) at 09/25/2017 0951 Last data filed at 09/24/2017 1803 Gross per 24 hour  Intake 840 ml  Output -  Net 840 ml   Filed Weights     09/23/17 0834 09/24/17 0615 09/25/17 0500  Weight: 83.1 kg (183 lb 3.2 oz) 84.5 kg (186 lb 4.6 oz) 84.4 kg (186 lb 1.1 oz)     Physical Exam:   Constitutional: NAD, deconditioned Eyes: lids and conjunctivae normal ENMT: Mucous membranes are moist. Mild pallor. No icterus Neck: normal, supple, no masses Respiratory: decreased breath sounds bilaterally with late expiratory wheeze. Mild rales in left lower and upper lobe. Normal respiratory effort. No accessory muscle use.  Cardiovascular: Regular rate and rhythm, normal S1-S2, no murmurs / rubs / gallops. No LE edema.  Abdomen: soft, nontender and nondistended. Musculoskeletal: no clubbing / cyanosis. No joint deformity upper and lower extremities. Normal muscle tone.  Skin: no rashes, lesions, ulcers. Neurologic: non-focal Psychiatric: Alert and oriented x 3   Data Reviewed:   I have independently reviewed following labs and imaging studies:   CBC: Recent Labs  Lab 09/23/17 0033 09/23/17 0843 09/24/17 0841 09/25/17 0628  WBC 8.7 5.3 4.1 4.3  NEUTROABS 6.5  --   --  3.7  HGB 12.1* 11.2* 12.2* 10.5*  HCT 34.8* 32.9* 36.8* 32.4*  MCV 78.9 79.5 81.8 81.8  PLT 169 139* 147* 164   Basic Metabolic Panel: Recent Labs  Lab 09/23/17 0033 09/23/17 0843 09/24/17 0841 09/25/17 0628  NA 133*  --  138 137  K 4.1  --  3.0* 4.0  CL 98*  --  108 108  CO2 25  --  21* 20*  GLUCOSE 107*  --  122* 142*  BUN 11  --  10 11  CREATININE 0.72 0.63 0.74 0.59*  CALCIUM 8.6*  --  8.3* 8.4*  MG 1.8  --   --   --    GFR: Estimated Creatinine Clearance: 116.5 mL/min (A) (by C-G formula based on SCr of 0.59 mg/dL (L)). Liver Function Tests: Recent Labs  Lab 09/23/17 0033  AST 82*  ALT 39  ALKPHOS 76  BILITOT 2.4*  PROT 6.8  ALBUMIN 2.4*   No results for input(s): LIPASE, AMYLASE in the last 168 hours. No results for input(s): AMMONIA in the last 168 hours. Coagulation Profile: Recent Labs  Lab 09/23/17 0033  INR 1.24    Cardiac Enzymes: Recent Labs  Lab 09/23/17 0033 09/23/17 0843  TROPONINI 0.07* 0.04*   BNP (last 3 results) No results for input(s): PROBNP in the last 8760 hours. HbA1C: No results for input(s): HGBA1C in the last 72 hours. CBG: Recent Labs  Lab 09/23/17 0033  GLUCAP 115*   Lipid Profile: No results for input(s): CHOL, HDL, LDLCALC, TRIG, CHOLHDL, LDLDIRECT in the last 72 hours. Thyroid Function Tests: No results for input(s): TSH, T4TOTAL, FREET4, T3FREE, THYROIDAB in the last 72 hours. Anemia Panel: No results for input(s): VITAMINB12, FOLATE, FERRITIN, TIBC, IRON, RETICCTPCT in the last 72 hours. Urine analysis:    Component Value Date/Time   BILIRUBINUR negative 06/26/2017 1600   KETONESUR negative 06/26/2017 1600   PROTEINUR =30 (A) 06/26/2017 1600   UROBILINOGEN 1.0 06/26/2017 1600   NITRITE Negative 06/26/2017 1600   LEUKOCYTESUR Small (1+) (A) 06/26/2017 1600   Sepsis Labs: Invalid input(s): PROCALCITONIN, LACTICIDVEN  No results  found for this or any previous visit (from the past 240 hour(s)).    Radiology Studies: No results found.    Medication:   . aspirin  324 mg Oral Once  . aspirin  81 mg Oral Daily  . azithromycin  500 mg Oral Daily  . enoxaparin (LOVENOX) injection  40 mg Subcutaneous Q24H  . escitalopram  20 mg Oral Daily  . ipratropium-albuterol  3 mL Nebulization Q6H  . methylPREDNISolone (SOLU-MEDROL) injection  40 mg Intravenous Daily  . metoprolol succinate  50 mg Oral Daily  . nicotine  21 mg Transdermal Daily  . QUEtiapine  100 mg Oral QHS  . sodium chloride flush  3 mL Intravenous Q12H  . tamsulosin  0.4 mg Oral Daily    Continuous Infusions: . cefTRIAXone (ROCEPHIN)  IV Stopped (09/24/17 1112)      Time spent: 25 minutes  Signed, Caprice Renshaw, PA-S [employer] Email: [email or web address]

## 2017-09-26 ENCOUNTER — Encounter (HOSPITAL_COMMUNITY): Payer: Self-pay | Admitting: Family Medicine

## 2017-09-26 DIAGNOSIS — D649 Anemia, unspecified: Secondary | ICD-10-CM

## 2017-09-26 DIAGNOSIS — A419 Sepsis, unspecified organism: Secondary | ICD-10-CM

## 2017-09-26 DIAGNOSIS — J181 Lobar pneumonia, unspecified organism: Secondary | ICD-10-CM

## 2017-09-26 LAB — CBC WITH DIFFERENTIAL/PLATELET
Basophils Absolute: 0 10*3/uL (ref 0.0–0.1)
Basophils Relative: 0 %
EOS ABS: 0 10*3/uL (ref 0.0–0.7)
EOS PCT: 1 %
HCT: 33 % — ABNORMAL LOW (ref 39.0–52.0)
HEMOGLOBIN: 10.8 g/dL — AB (ref 13.0–17.0)
LYMPHS ABS: 0.9 10*3/uL (ref 0.7–4.0)
Lymphocytes Relative: 22 %
MCH: 27 pg (ref 26.0–34.0)
MCHC: 32.7 g/dL (ref 30.0–36.0)
MCV: 82.5 fL (ref 78.0–100.0)
MONO ABS: 0.2 10*3/uL (ref 0.1–1.0)
MONOS PCT: 4 %
NEUTROS PCT: 73 %
Neutro Abs: 3 10*3/uL (ref 1.7–7.7)
Platelets: 154 10*3/uL (ref 150–400)
RBC: 4 MIL/uL — ABNORMAL LOW (ref 4.22–5.81)
RDW: 16.6 % — AB (ref 11.5–15.5)
WBC: 4.1 10*3/uL (ref 4.0–10.5)

## 2017-09-26 LAB — BASIC METABOLIC PANEL
Anion gap: 8 (ref 5–15)
BUN: 12 mg/dL (ref 6–20)
CHLORIDE: 111 mmol/L (ref 101–111)
CO2: 20 mmol/L — AB (ref 22–32)
CREATININE: 0.71 mg/dL (ref 0.61–1.24)
Calcium: 8.7 mg/dL — ABNORMAL LOW (ref 8.9–10.3)
GFR calc Af Amer: >60 mL/min (ref >60)
GFR calc non Af Amer: >60 mL/min (ref >60)
GLUCOSE: 115 mg/dL — AB (ref 65–99)
Potassium: 3.7 mmol/L (ref 3.5–5.1)
Sodium: 139 mmol/L (ref 135–145)

## 2017-09-26 MED ORDER — CEFUROXIME AXETIL 500 MG PO TABS
500.0000 mg | ORAL_TABLET | Freq: Two times a day (BID) | ORAL | Status: DC
  Administered 2017-09-27: 500 mg via ORAL
  Filled 2017-09-26 (×2): qty 1

## 2017-09-26 MED ORDER — PREDNISONE 20 MG PO TABS
40.0000 mg | ORAL_TABLET | Freq: Every day | ORAL | Status: DC
Start: 2017-09-26 — End: 2017-09-27
  Administered 2017-09-26 – 2017-09-27 (×2): 40 mg via ORAL
  Filled 2017-09-26 (×2): qty 2

## 2017-09-26 MED ORDER — IPRATROPIUM-ALBUTEROL 0.5-2.5 (3) MG/3ML IN SOLN
3.0000 mL | Freq: Three times a day (TID) | RESPIRATORY_TRACT | Status: DC
  Administered 2017-09-27: 3 mL via RESPIRATORY_TRACT
  Filled 2017-09-26: qty 3

## 2017-09-26 NOTE — Progress Notes (Signed)
PROGRESS NOTE  Edward Little BJY:[id number] DOB: [birth year] DOA: 09/23/2017 PCP: Myles LippsSantiago, Irma M, MD Gemma Payoruffolo, Thomas A, MD   Brief Narrative: 57 year old man PMH coronary artery disease, peripheral vascular disease, cirrhosis, presented with chest pain, cough, fever.  CT imaging revealed multilobar pneumonia.  Admitted for same.  Assessment/Plan Sepsis secondary to multilobar pneumonia.  CT chest no PE.  Multifocal pneumonia noted.  Moderate centrilobular emphysema. --Sepsis resolved --Afebrile 72 hours.  No leukocytosis.  However, still short of breath and not at baseline, unable to complete normal length sentences. --Continue IV antibiotics today.  Acute COPD exacerbation --Appears resolved.  Continue bronchodilators.  Continue prednisone.  Elevated troponin in context of sepsis on admission.  Troponin trended down.  No evidence of ACS.  No further evaluation suggested.  Normocytic anemia. --Remains stable.  Follow-up as an outpatient.  Chronic hepatitis C, steatohepatitis with bridging fibrosis followed by Dr. Ellard Artisuffolo  Asymptomatic grade 2 diastolic dysfunction. --Follow-up as an outpatient   Likely change to oral antibiotics 3/7 if continues to improve and discharged home at that time.  DVT prophylaxis: Enoxaparin Code Status: Full Family Communication: none Disposition Plan: home    Brendia Sacksaniel Konya Fauble, MD  [facility] Direct contact: [phone] --Via amion app OR  --[email or web address]; password [email or web address]  7PM-7AM contact night coverage as above 09/26/2017, 12:55 PM  LOS: 2 days   Consultants:    Procedures:  Echo  Study Conclusions  - Left ventricle: The cavity size was normal. There was mild   concentric hypertrophy. Systolic function was vigorous. The   estimated ejection fraction was in the range of 65% to 70%. Wall   motion was normal; there were no regional wall motion   abnormalities. Features are consistent with a pseudonormal left   ventricular  filling pattern, with concomitant abnormal relaxation   and increased filling pressure (grade 2 diastolic dysfunction). - Aortic valve: Trileaflet; mildly thickened, mildly calcified   leaflets. Transvalvular velocity was within the normal range.   There was no stenosis. There was no regurgitation. - Aortic root: The aortic root was normal in size. - Mitral valve: There was mild regurgitation. - Left atrium: The atrium was moderately dilated. - Right ventricle: Systolic function was normal. - Tricuspid valve: There was mild regurgitation. - Pulmonary arteries: Systolic pressure was within the normal   range. - Inferior vena cava: The vessel was normal in size. - Pericardium, extracardiac: There was no pericardial effusion.  Antimicrobials:  Azithromycin 3/4 >> 3/8  Ceftriaxone 3/3 >> 3/6  Ceftin 3/7 >> 3/8   Interval history/Subjective: Still short of breath, not back to baseline.  However, feeling better.  No nausea or vomiting.  Tolerating diet.  Objective: Vitals:  Vitals:   09/26/17 0454 09/26/17 0935  BP: (!) 147/70   Pulse: 62   Resp: 18   Temp: (!) 97.5 F (36.4 C)   SpO2: 93% 95%    Exam:  Constitutional:  . Appears ill but not toxic. Eyes:  . pupils and irises appear normal . Normal lids ENMT:  . Slightly hard of hearing Respiratory:  . CTA bilaterally, no w/r/r.  . Respiratory effort moderately increased.  Able to speak in short sentences but does need to pause frequently during conversation. Cardiovascular:  . RRR, no m/r/g . No LE extremity edema   Psychiatric:  . Mental status o Mood, affect appropriate  I have personally reviewed the following:   Labs:  Basic metabolic panel unremarkable  Hemoglobin stable 10.8, remainder unremarkable  Imaging studies:  Chest x-ray and  CT reviewed  Scheduled Meds: . aspirin  324 mg Oral Once  . aspirin  81 mg Oral Daily  . azithromycin  500 mg Oral Daily  . [START ON 09/27/2017] cefUROXime  500 mg  Oral BID WC  . enoxaparin (LOVENOX) injection  40 mg Subcutaneous Q24H  . escitalopram  20 mg Oral Daily  . fluticasone furoate-vilanterol  1 puff Inhalation Daily  . guaiFENesin-dextromethorphan  5 mL Oral Q6H  . ibuprofen  400 mg Oral TID  . ipratropium-albuterol  3 mL Nebulization Q6H  . metoprolol succinate  50 mg Oral Daily  . nicotine  21 mg Transdermal Daily  . polyethylene glycol  17 g Oral Daily  . predniSONE  40 mg Oral Q breakfast  . QUEtiapine  100 mg Oral QHS  . sodium chloride flush  3 mL Intravenous Q12H  . tamsulosin  0.4 mg Oral Daily   Continuous Infusions:   Principal Problem:   Lobar pneumonia (HCC) Active Problems:   Essential hypertension   Elevated troponin   Sepsis (HCC)   Normocytic anemia   LOS: 2 days

## 2017-09-26 NOTE — Progress Notes (Signed)
PT demonstrated hands on understanding of Flutter device. PC at this time. 

## 2017-09-27 DIAGNOSIS — A419 Sepsis, unspecified organism: Principal | ICD-10-CM

## 2017-09-27 MED ORDER — CEFUROXIME AXETIL 500 MG PO TABS: 500.0000 mg | ORAL_TABLET | Freq: Two times a day (BID) | ORAL | 0 refills | Status: AC

## 2017-09-27 MED ORDER — PREDNISONE 10 MG PO TABS: ORAL_TABLET | ORAL | 0 refills | Status: DC

## 2017-09-27 MED ORDER — GUAIFENESIN-DM 100-10 MG/5ML PO SYRP: 5.0000 mL | ORAL_SOLUTION | Freq: Four times a day (QID) | ORAL | 0 refills | Status: DC

## 2017-09-27 MED ORDER — FLUTICASONE FUROATE-VILANTEROL 200-25 MCG/INH IN AEPB: 1.0000 | INHALATION_SPRAY | Freq: Every day | RESPIRATORY_TRACT | 0 refills | Status: AC

## 2017-09-27 NOTE — Care Management Note (Signed)
Case Management Note  Patient Details  Name: Edward Little MRN: [id number] Date of Birth: 12-21-1960  Subjective/Objective:                    Action/Plan:   Expected Discharge Date:  09/27/17               Expected Discharge Plan:  Home/Self Care  In-House Referral:     Discharge planning Services  CM Consult  Post Acute Care Choice:    Choice offered to:     DME Arranged:    DME Agency:     HH Arranged:    HH Agency:     Status of Service:  Completed, signed off  If discussed at [facility] Length of Stay Meetings, dates discussed:    Additional CommentsGeni Little:  Edward Allerton, RN 09/27/2017, 12:21 PM

## 2017-09-27 NOTE — Progress Notes (Signed)
LCSW gave patient taxi voucher. Patient does not have resources to pay for transportation and is not appropriate to take the bus.   Edward GandyBernette Hooria Little, LSCW [facility] CSW [phone]

## 2017-09-27 NOTE — Care Management Important Message (Signed)
Important Message  Patient Details  Name: Edward Little MRN: [id number] Date of Birth: 28-Aug-1960   Medicare Important Message Given:  Yes    Caren MacadamFuller, Paije Goodhart 09/27/2017, 10:51 AMImportant Message  Patient Details  Name: Edward Little MRN: [id number] Date of Birth: 28-Aug-1960   Medicare Important Message Given:  Yes    Caren MacadamFuller, Whitnee Orzel 09/27/2017, 10:50 AM

## 2017-09-27 NOTE — Progress Notes (Signed)
Patient has discharged to home on 09/27/17. Discharge instructions including medications and appointments were given to patient. Patient has no question at this time. SW is notified for cab voucher.

## 2017-09-27 NOTE — Discharge Summary (Addendum)
Physician Discharge Summary  Edward Little  ZOX:[id number]  DOB: 10/04/1960  DOA: 09/23/2017 PCP: [name], MD  Admit date: 09/23/2017 Discharge date: 09/27/2017  Admitted From: Home  Disposition: Home   Recommendations for Outpatient Follow-up:  1. Follow up with PCP in 1-2 weeks 2. Please obtain BMP/CBC in one week 3. Repeat chest x-ray in 6 weeks to monitor resolution of xray findings   Discharge Condition: Stable CODE STATUS: Full code Diet recommendation: Heart Healthy   Brief/Interim Summary: For full details see H&P/Progress note, but in brief, Edward Little is a 57 year old male with past medical history of CAD, PVD, cirrhosis, presented to the emergency chest pain, cough and fever.  Initial evaluation CT of the chest revealed pneumonia.  Patient was admitted with sepsis secondary to pneumonia and treated w IV antibiotics.  Patient subsequently improved and remained febrile.  Patient was switched to oral antibiotics and deemed to be stable for discharge.  Subjective: Patient seen and examined, complaining of mild nausea and one episode of loose stools.  Denies chest pain, shortness of breath, abdominal pain and vomiting.  Tolerating diet well.  Remains febrile for > 72 hours.   Discharge Diagnoses/Hospital Course:  Sepsis secondary to multilobar pneumonia (community-acquired) Sepsis physiology has resolved.  CT of the chest shows no PE, moderate centrilobular emphysema and multifocal pneumonia noted. Patient was treated with antibiotics including vancomycin, cefepime, azithromycin and he will be discharged on Ceftin to complete 7 days of antibiotic therapy. Remains afebrile for over 72hrs.  No leukocytosis Follow-up with PCP in 1-2 weeks. Repeat chest x-ray in 6-week  Acute COPD exacerbation Due to pneumonia, appears resolved Continue prednisone taper and bronchodilators Smoking cessation discussed   Elevated troponin Felt to be related to demand ischemia from  sepsis No evidence of ACS, no further cardiac evaluation indicated at this time  Chronic hepatitis C, steatohepatitis - stable Follow-up as an outpatient  Chronic diastolic dysfunction Seems to be compensated, no signs of fluid overload Follow up as outpatient   All other chronic medical condition were stable during the hospitalization.  On the day of the discharge the patient's vitals were stable, and no other acute medical condition were reported by patient. the patient was felt safe to be discharge to home   Discharge Instructions  You were cared for by a hospitalist during your hospital stay. If you have any questions about your discharge medications or the care you received while you were in the hospital after you are discharged, you can call the unit and asked to speak with the hospitalist on call if the hospitalist that took care of you is not available. Once you are discharged, your primary care physician will handle any further medical issues. Please note that NO REFILLS for any discharge medications will be authorized once you are discharged, as it is imperative that you return to your primary care physician (or establish a relationship with a primary care physician if you do not have one) for your aftercare needs so that they can reassess your need for medications and monitor your lab values.  Discharge Instructions    Call MD for:  difficulty breathing, headache or visual disturbances   Complete by:  As directed    Call MD for:  extreme fatigue   Complete by:  As directed    Call MD for:  hives   Complete by:  As directed    Call MD for:  persistant dizziness or light-headedness   Complete by:  As directed  Call MD for:  persistant nausea and vomiting   Complete by:  As directed    Call MD for:  redness, tenderness, or signs of infection (pain, swelling, redness, odor or green/yellow discharge around incision site)   Complete by:  As directed    Call MD for:  severe  uncontrolled pain   Complete by:  As directed    Call MD for:  temperature >100.4   Complete by:  As directed    Diet - low sodium heart healthy   Complete by:  As directed    Increase activity slowly   Complete by:  As directed      Allergies as of 09/27/2017      Reactions   Iodinated Diagnostic Agents Hives   Ketorolac Tromethamine Diarrhea   Stomach cramps   Tape Itching      Medication List    STOP taking these medications   cyclobenzaprine 10 MG tablet Commonly known as:  FLEXERIL   metoprolol tartrate 25 MG tablet Commonly known as:  LOPRESSOR     TAKE these medications   albuterol 108 (90 Base) MCG/ACT inhaler Commonly known as:  PROVENTIL HFA;VENTOLIN HFA Inhale 2 puffs into the lungs every 4 (four) hours as needed for wheezing.   aspirin 81 MG chewable tablet Chew 1 tablet (81 mg total) by mouth daily. For heart health   cefUROXime 500 MG tablet Commonly known as:  CEFTIN Take 1 tablet (500 mg total) by mouth 2 (two) times daily with a meal for 2 days.   escitalopram 20 MG tablet Commonly known as:  LEXAPRO Take 1 tablet (20 mg total) by mouth daily. For depression   fluticasone furoate-vilanterol 200-25 MCG/INH Aepb Commonly known as:  BREO ELLIPTA Inhale 1 puff into the lungs daily. Start taking on:  09/28/2017   guaiFENesin-dextromethorphan 100-10 MG/5ML syrup Commonly known as:  ROBITUSSIN DM Take 5 mLs by mouth every 6 (six) hours.   hydrOXYzine 50 MG tablet Commonly known as:  ATARAX/VISTARIL Take 1 tablet (50 mg total) by mouth every 6 (six) hours as needed for anxiety.   metoprolol succinate 50 MG 24 hr tablet Commonly known as:  TOPROL-XL Take 50 mg by mouth daily. Take with or immediately following a meal.   nicotine 21 mg/24hr patch Commonly known as:  NICODERM CQ - dosed in mg/24 hours Place 1 patch (21 mg total) onto the skin daily. For smoking cessation   ondansetron 4 MG disintegrating tablet Commonly known as:  ZOFRAN-ODT Take 1  tablet (4 mg total) by mouth every 6 (six) hours as needed for nausea or vomiting.   predniSONE 10 MG tablet Commonly known as:  DELTASONE Take 4 tablets for 3 days; Take 3 tablets for 4 days; Take 2 tablets for 3 days; Take 1 tablet for 4 days   QUEtiapine 100 MG tablet Commonly known as:  SEROQUEL Take 1 tablet (100 mg total) by mouth at bedtime. For mood control   tamsulosin 0.4 MG Caps capsule Commonly known as:  FLOMAX Take 1 capsule (0.4 mg total) by mouth daily. For prostate health      Follow-up Information    [name], MD. Schedule an appointment as soon as possible for a visit in 1 week(s).   Specialty:  Family Medicine Why:  Hospital follow up  Contact information: [address] Gin[place] [address] [phone]          Allergies  Allergen Reactions  . Iodinated Diagnostic Agents Hives  . Ketorolac Tromethamine  Diarrhea    Stomach cramps  . Tape Itching    Consultations:  None    Procedures/Studies: Ct Angio Chest Pe W/cm &/or Wo Cm  Result Date: 09/23/2017 CLINICAL DATA:  57 y/o M; fever and chest pain. Elevated D-dimer. Productive cough with green sputum. EXAM: CT ANGIOGRAPHY CHEST WITH CONTRAST TECHNIQUE: Multidetector CT imaging of the chest was performed using the standard protocol during bolus administration of intravenous contrast. Multiplanar CT image reconstructions and MIPs were obtained to evaluate the vascular anatomy. CONTRAST:  100mL ISOVUE-370 IOPAMIDOL (ISOVUE-370) INJECTION 76% COMPARISON:  09/23/2017 chest radiograph FINDINGS: Cardiovascular: Satisfactory opacification of the pulmonary arteries. Mild respiratory artifact. Dense contrast in the SVC with streak artifact suprahilar region. No pulmonary embolus identified. Normal heart size. Normal caliber thoracic aorta and main pulmonary artery. No pericardial effusion. Mediastinum/Nodes: Diffuse prominence of pulmonary lymph nodes measuring up to 14 x 13 mm in the left hilum (series 5,  image 137). Calcified right-sided hilar lymph nodes. Mildly patulous thoracic esophagus. Normal thyroid gland. Lungs/Pleura: Diffuse patchy ground-glass opacities with dense consolidation in the left upper lobe and left lower lobes compatible with multifocal pneumonia. Moderate centrilobular emphysema. No pleural effusion or pneumothorax. Upper Abdomen: Partially visualized splenomegaly. Musculoskeletal: Chronic right fifth anterolateral rib fracture. No acute osseous abnormality identified. Review of the MIP images confirms the above findings. IMPRESSION: 1. No pulmonary embolus identified. 2. Multifocal pneumonia. No cavitation. Mediastinal lymph nodes greatest in left hilar region, likely reactive. Follow-up to resolution recommended to exclude underlying malignancy. 3. Moderate centrilobular emphysema. 4. Partially visualized splenomegaly. Electronically Signed   By: Mitzi HansenLance  Furusawa-Stratton M.D.   On: 09/23/2017 06:14   Dg Chest Portable 1 View  Result Date: 09/23/2017 CLINICAL DATA:  57 y/o  M; chest pain and shortness of breath. EXAM: PORTABLE CHEST 1 VIEW COMPARISON:  08/16/2017 chest radiograph. FINDINGS: Normal cardiac silhouette. Aortic atherosclerosis with calcification. Left mid and lower lung zone consolidation. No pleural effusion or pneumothorax. No acute osseous abnormality is evident. IMPRESSION: Left mid and lower lung zone consolidation likely representing pneumonia. Electronically Signed   By: Mitzi HansenLance  Furusawa-Stratton M.D.   On: 09/23/2017 00:56    Discharge Exam: Vitals:   09/27/17 0528 09/27/17 0757  BP: (!) 163/86   Pulse: 63   Resp: 20   Temp: (!) 97.5 F (36.4 C)   SpO2: 96% 98%   Vitals:   09/26/17 2136 09/27/17 0500 09/27/17 0528 09/27/17 0757  BP: (!) 148/85  (!) 163/86   Pulse: 74  63   Resp: 20  20   Temp: (!) 97.3 F (36.3 C)  (!) 97.5 F (36.4 C)   TempSrc: Axillary  Oral   SpO2: 98%  96% 98%  Weight:  80.9 kg (178 lb 5.6 oz)    Height:        General:  Pt is alert, awake, not in acute distress Cardiovascular: RRR, S1/S2 +, no rubs, no gallops Respiratory: CTA no rhonchi or wheezing  Abdominal: Soft, NT, ND, bowel sounds + Extremities: no edema   The results of significant diagnostics from this hospitalization (including imaging, microbiology, ancillary and laboratory) are listed below for reference.     Microbiology: No results found for this or any previous visit (from the past 240 hour(s)).   Labs: BNP (last 3 results) Recent Labs    09/23/17 0033  BNP 2,157.3*   Basic Metabolic Panel: Recent Labs  Lab 09/23/17 0033 09/23/17 0843 09/24/17 0841 09/25/17 0628 09/26/17 0615  NA 133*  --  138 137  139  K 4.1  --  3.0* 4.0 3.7  CL 98*  --  108 108 111  CO2 25  --  21* 20* 20*  GLUCOSE 107*  --  122* 142* 115*  BUN 11  --  10 11 12   CREATININE 0.72 0.63 0.74 0.59* 0.71  CALCIUM 8.6*  --  8.3* 8.4* 8.7*  MG 1.8  --   --   --   --    Liver Function Tests: Recent Labs  Lab 09/23/17 0033  AST 82*  ALT 39  ALKPHOS 76  BILITOT 2.4*  PROT 6.8  ALBUMIN 2.4*   No results for input(s): LIPASE, AMYLASE in the last 168 hours. No results for input(s): AMMONIA in the last 168 hours. CBC: Recent Labs  Lab 09/23/17 0033 09/23/17 0843 09/24/17 0841 09/25/17 0628 09/26/17 0615  WBC 8.7 5.3 4.1 4.3 4.1  NEUTROABS 6.5  --   --  3.7 3.0  HGB 12.1* 11.2* 12.2* 10.5* 10.8*  HCT 34.8* 32.9* 36.8* 32.4* 33.0*  MCV 78.9 79.5 81.8 81.8 82.5  PLT 169 139* 147* 164 154   Cardiac Enzymes: Recent Labs  Lab 09/23/17 0033 09/23/17 0843  TROPONINI 0.07* 0.04*   BNP: Invalid input(s): POCBNP CBG: Recent Labs  Lab 09/23/17 0033  GLUCAP 115*   D-Dimer No results for input(s): DDIMER in the last 72 hours. Hgb A1c No results for input(s): HGBA1C in the last 72 hours. Lipid Profile No results for input(s): CHOL, HDL, LDLCALC, TRIG, CHOLHDL, LDLDIRECT in the last 72 hours. Thyroid function studies No results for input(s):  TSH, T4TOTAL, T3FREE, THYROIDAB in the last 72 hours.  Invalid input(s): FREET3 Anemia work up No results for input(s): VITAMINB12, FOLATE, FERRITIN, TIBC, IRON, RETICCTPCT in the last 72 hours. Urinalysis    Component Value Date/Time   BILIRUBINUR negative 06/26/2017 1600   KETONESUR negative 06/26/2017 1600   PROTEINUR =30 (A) 06/26/2017 1600   UROBILINOGEN 1.0 06/26/2017 1600   NITRITE Negative 06/26/2017 1600   LEUKOCYTESUR Small (1+) (A) 06/26/2017 1600   Sepsis Labs Invalid input(s): PROCALCITONIN,  WBC,  LACTICIDVEN Microbiology No results found for this or any previous visit (from the past 240 hour(s)).   Time coordinating discharge: 32 minutes  SIGNED:  Latrelle Dodrill, MD  [facility] 09/27/2017, 11:27 AM  Pager please text page via  [email or web address]  Note - This record has been created using [employer]. Chart creation errors have been sought, but may not always have been located. Such creation errors do not reflect on the standard of medical care.

## 2017-10-26 ENCOUNTER — Encounter (HOSPITAL_COMMUNITY): Payer: Self-pay | Admitting: Emergency Medicine

## 2017-10-26 ENCOUNTER — Emergency Department (HOSPITAL_COMMUNITY)
Admission: EM | Admit: 2017-10-26 | Discharge: 2017-10-27 | Disposition: A | Discharge status: Home / Self Care | Payer: Medicare Other | Attending: Emergency Medicine | Admitting: Emergency Medicine

## 2017-10-26 ENCOUNTER — Other Ambulatory Visit: Payer: Self-pay

## 2017-10-26 DIAGNOSIS — R45851 Suicidal ideations: Secondary | ICD-10-CM | POA: Diagnosis not present

## 2017-10-26 DIAGNOSIS — F1120 Opioid dependence, uncomplicated: Secondary | ICD-10-CM | POA: Insufficient documentation

## 2017-10-26 DIAGNOSIS — Z7982 Long term (current) use of aspirin: Secondary | ICD-10-CM | POA: Diagnosis not present

## 2017-10-26 DIAGNOSIS — F1414 Cocaine abuse with cocaine-induced mood disorder: Secondary | ICD-10-CM | POA: Diagnosis not present

## 2017-10-26 DIAGNOSIS — F17210 Nicotine dependence, cigarettes, uncomplicated: Secondary | ICD-10-CM | POA: Insufficient documentation

## 2017-10-26 DIAGNOSIS — I739 Peripheral vascular disease, unspecified: Secondary | ICD-10-CM | POA: Diagnosis not present

## 2017-10-26 DIAGNOSIS — I10 Essential (primary) hypertension: Secondary | ICD-10-CM | POA: Insufficient documentation

## 2017-10-26 DIAGNOSIS — I252 Old myocardial infarction: Secondary | ICD-10-CM | POA: Insufficient documentation

## 2017-10-26 DIAGNOSIS — F329 Major depressive disorder, single episode, unspecified: Secondary | ICD-10-CM | POA: Diagnosis present

## 2017-10-26 DIAGNOSIS — F1910 Other psychoactive substance abuse, uncomplicated: Secondary | ICD-10-CM | POA: Diagnosis not present

## 2017-10-26 DIAGNOSIS — I2510 Atherosclerotic heart disease of native coronary artery without angina pectoris: Secondary | ICD-10-CM | POA: Insufficient documentation

## 2017-10-26 DIAGNOSIS — Z79899 Other long term (current) drug therapy: Secondary | ICD-10-CM | POA: Diagnosis not present

## 2017-10-26 DIAGNOSIS — J449 Chronic obstructive pulmonary disease, unspecified: Secondary | ICD-10-CM | POA: Insufficient documentation

## 2017-10-26 LAB — CBC
HCT: 38.6 % — ABNORMAL LOW (ref 39.0–52.0)
HEMOGLOBIN: 12.4 g/dL — AB (ref 13.0–17.0)
MCH: 26.8 pg (ref 26.0–34.0)
MCHC: 32.1 g/dL (ref 30.0–36.0)
MCV: 83.4 fL (ref 78.0–100.0)
Platelets: 111 10*3/uL — ABNORMAL LOW (ref 150–400)
RBC: 4.63 MIL/uL (ref 4.22–5.81)
RDW: 15.9 % — AB (ref 11.5–15.5)
WBC: 6.3 10*3/uL (ref 4.0–10.5)

## 2017-10-26 LAB — COMPREHENSIVE METABOLIC PANEL
ALK PHOS: 115 U/L (ref 38–126)
ALT: 58 U/L (ref 17–63)
ANION GAP: 9 (ref 5–15)
AST: 52 U/L — ABNORMAL HIGH (ref 15–41)
Albumin: 3.5 g/dL (ref 3.5–5.0)
BUN: 7 mg/dL (ref 6–20)
CALCIUM: 8.8 mg/dL — AB (ref 8.9–10.3)
CO2: 23 mmol/L (ref 22–32)
Chloride: 102 mmol/L (ref 101–111)
Creatinine, Ser: 0.68 mg/dL (ref 0.61–1.24)
GFR calc non Af Amer: >60 mL/min (ref >60)
Glucose, Bld: 130 mg/dL — ABNORMAL HIGH (ref 65–99)
POTASSIUM: 3.6 mmol/L (ref 3.5–5.1)
SODIUM: 134 mmol/L — AB (ref 135–145)
Total Bilirubin: 0.7 mg/dL (ref 0.3–1.2)
Total Protein: 7.6 g/dL (ref 6.5–8.1)

## 2017-10-26 LAB — ACETAMINOPHEN LEVEL

## 2017-10-26 LAB — RAPID URINE DRUG SCREEN, HOSP PERFORMED
Amphetamines: NOT DETECTED
BENZODIAZEPINES: NOT DETECTED
Barbiturates: NOT DETECTED
COCAINE: POSITIVE — AB
OPIATES: POSITIVE — AB
TETRAHYDROCANNABINOL: NOT DETECTED

## 2017-10-26 LAB — ETHANOL: Alcohol, Ethyl (B): <10 mg/dL (ref <10)

## 2017-10-26 LAB — SALICYLATE LEVEL

## 2017-10-26 MED ORDER — ALBUTEROL SULFATE HFA 108 (90 BASE) MCG/ACT IN AERS: 2.0000 | INHALATION_SPRAY | RESPIRATORY_TRACT | Status: DC | PRN

## 2017-10-26 MED ORDER — HYDROXYZINE HCL 25 MG PO TABS: 50.0000 mg | ORAL_TABLET | Freq: Four times a day (QID) | ORAL | Status: DC | PRN

## 2017-10-26 MED ORDER — ESCITALOPRAM OXALATE 10 MG PO TABS
20.0000 mg | ORAL_TABLET | Freq: Every day | ORAL | Status: DC
  Administered 2017-10-26 – 2017-10-27 (×2): 20 mg via ORAL
  Filled 2017-10-26 (×2): qty 2

## 2017-10-26 MED ORDER — METOPROLOL SUCCINATE ER 50 MG PO TB24
50.0000 mg | ORAL_TABLET | Freq: Every day | ORAL | Status: DC
  Administered 2017-10-26 – 2017-10-27 (×2): 50 mg via ORAL
  Filled 2017-10-26 (×2): qty 1

## 2017-10-26 MED ORDER — QUETIAPINE FUMARATE 100 MG PO TABS
100.0000 mg | ORAL_TABLET | Freq: Every day | ORAL | Status: DC
  Administered 2017-10-26: 100 mg via ORAL
  Filled 2017-10-26: qty 1

## 2017-10-26 MED ORDER — ASPIRIN 81 MG PO CHEW
81.0000 mg | CHEWABLE_TABLET | Freq: Every day | ORAL | Status: DC
  Administered 2017-10-26 – 2017-10-27 (×2): 81 mg via ORAL
  Filled 2017-10-26 (×2): qty 1

## 2017-10-26 NOTE — ED Provider Notes (Signed)
[facility]-EMERGENCY DEPT Provider Note   CSN: [id number] Arrival date & time: 10/26/17  1721     History   Chief Complaint Chief Complaint  Patient presents with  . Suicidal    HPI Edward Little is a 57 y.o. male.  Patient is a 57 year old male who presents with suicidal ideations.  He has a history of depression.  He reports worsening depression over the last several weeks.  He has been out of his depression medications for about the last 10 days.  He does have refills but he has not been able to afford to get the refills at this point.  He is having suicidal ideations and feels like he does not have any will to live.  He is thought about taking overdose on his home medications.  He has a lot of chronic health issues including COPD, coronary artery disease, peripheral vascular disease with chronic claudication and pain in his lower extremities as well as a AAA status post endograft repair.  He does not have any active issues.  He was recently treated for pneumonia but he states his symptoms are better.  He does have some intermittent chronic abdominal pain but denies any currently.  He does have shortness of breath which is at baseline.  He denies any alcohol use.  He does report heroin use, last time used was last night as well as occasional cocaine use.     Past Medical History:  Diagnosis Date  . AAA (abdominal aortic aneurysm) (HCC)   . Abdominal hernia   . CAD (coronary artery disease)   . Cellulitis   . Chronic hepatitis C (HCC)   . Cirrhosis of liver (HCC)   . COPD (chronic obstructive pulmonary disease) (HCC)   . Heart attack (HCC)   . PVD (peripheral vascular disease) (HCC)   . Spleen enlarged     Patient Active Problem List   Diagnosis Date Noted  . Sepsis (HCC) 09/26/2017  . Lobar pneumonia (HCC) 09/26/2017  . Normocytic anemia 09/26/2017  . Essential hypertension 09/23/2017  . Elevated troponin 09/23/2017  . BPH (benign prostatic  hyperplasia) 09/23/2017  . Opioid use disorder, severe, dependence (HCC) 08/17/2017  . MDD (major depressive disorder), severe (HCC) 08/16/2017    Past Surgical History:  Procedure Laterality Date  . ABDOMINAL AORTIC ANEURYSM REPAIR W/ ENDOLUMINAL GRAFT    . CHOLECYSTECTOMY    . HERNIA REPAIR    . periperial stent    . revision of AAA graft          Home Medications    Prior to Admission medications   Medication Sig Start Date End Date Taking? Authorizing Provider  albuterol (PROVENTIL HFA;VENTOLIN HFA) 108 (90 Base) MCG/ACT inhaler Inhale 2 puffs into the lungs every 4 (four) hours as needed for wheezing. 08/20/17  Yes Armandina StammerNwoko, Agnes I, NP  aspirin 81 MG chewable tablet Chew 1 tablet (81 mg total) by mouth daily. For heart health 08/21/17  Yes Armandina StammerNwoko, Agnes I, NP  Carboxymethylcellulose Sodium (EYE DROPS OP) Place 1 drop into both eyes daily as needed (DRY, ITCHY EYES).   Yes [provider]  escitalopram (LEXAPRO) 20 MG tablet Take 1 tablet (20 mg total) by mouth daily. For depression 08/21/17  Yes Armandina StammerNwoko, Agnes I, NP  hydrOXYzine (ATARAX/VISTARIL) 50 MG tablet Take 1 tablet (50 mg total) by mouth every 6 (six) hours as needed for anxiety. 08/20/17  Yes Armandina StammerNwoko, Agnes I, NP  ibuprofen (ADVIL,MOTRIN) 200 MG tablet Take 600 mg by mouth  daily as needed.   Yes [provider]  metoprolol succinate (TOPROL-XL) 50 MG 24 hr tablet Take 50 mg by mouth daily. Take with or immediately following a meal.   Yes [provider]  ondansetron (ZOFRAN-ODT) 4 MG disintegrating tablet Take 1 tablet (4 mg total) by mouth every 6 (six) hours as needed for nausea or vomiting. 08/20/17  Yes Armandina Stammer I, NP  QUEtiapine (SEROQUEL) 100 MG tablet Take 1 tablet (100 mg total) by mouth at bedtime. For mood control 08/20/17  Yes Nwoko, Nicole Kindred I, NP  fluticasone furoate-vilanterol (BREO ELLIPTA) 200-25 MCG/INH AEPB Inhale 1 puff into the lungs daily. Patient not taking: Reported on 10/26/2017 09/28/17    Randel Pigg, Dorma Russell, MD  guaiFENesin-dextromethorphan [facility] DM) 100-10 MG/5ML syrup Take 5 mLs by mouth every 6 (six) hours. Patient not taking: Reported on 10/26/2017 09/27/17   Randel Pigg, Dorma Russell, MD  nicotine (NICODERM CQ - DOSED IN MG/24 HOURS) 21 mg/24hr patch Place 1 patch (21 mg total) onto the skin daily. For smoking cessation Patient not taking: Reported on 10/26/2017 08/21/17   Armandina Stammer I, NP  predniSONE (DELTASONE) 10 MG tablet Take 4 tablets for 3 days; Take 3 tablets for 4 days; Take 2 tablets for 3 days; Take 1 tablet for 4 days Patient not taking: Reported on 10/26/2017 09/27/17   Randel Pigg, Dorma Russell, MD  tamsulosin [facility]) 0.4 MG CAPS capsule Take 1 capsule (0.4 mg total) by mouth daily. For prostate health Patient not taking: Reported on 10/26/2017 08/21/17   Sanjuana Kava, NP    Family History Family History  Problem Relation Age of Onset  . Cancer - Other Mother        bone, melenoma  . Heart attack Paternal Grandfather        Died 67    Social History Social History   Tobacco Use  . Smoking status: Current Every Day Smoker    Packs/day: 1.00    Types: Cigarettes  . Smokeless tobacco: Current User  Substance Use Topics  . Alcohol use: No  . Drug use: Yes    Comment: heroine     Allergies   Iodinated diagnostic agents; Ketorolac tromethamine; and Tape   Review of Systems Review of Systems  Constitutional: Negative for chills, diaphoresis, fatigue and fever.  HENT: Negative for congestion, rhinorrhea and sneezing.   Eyes: Negative.   Respiratory: Negative for cough, chest tightness and shortness of breath.   Cardiovascular: Negative for chest pain and leg swelling.  Gastrointestinal: Negative for abdominal pain, blood in stool, diarrhea, nausea and vomiting.  Genitourinary: Negative for difficulty urinating, flank pain, frequency and hematuria.  Musculoskeletal: Negative for arthralgias and back pain.       Chronic leg pain  Skin: Negative for rash.   Neurological: Negative for dizziness, speech difficulty, weakness, numbness and headaches.  Psychiatric/Behavioral: Positive for dysphoric mood and suicidal ideas.     Physical Exam Updated Vital Signs BP (!) 199/95 (BP Location: Right Arm)   Pulse 90   Temp 97.8 F (36.6 C) (Oral)   Resp 17   SpO2 98%   Physical Exam  Constitutional: He is oriented to person, place, and time. He appears well-developed and well-nourished.  HENT:  Head: Normocephalic and atraumatic.  Eyes: Pupils are equal, round, and reactive to light.  Neck: Normal range of motion. Neck supple.  Cardiovascular: Normal rate, regular rhythm and normal heart sounds.  Pulmonary/Chest: Effort normal and breath sounds normal. No respiratory distress. He has no wheezes.  He has no rales. He exhibits no tenderness.  Abdominal: Soft. Bowel sounds are normal. There is no tenderness. There is no rebound and no guarding.  Large, soft, nontender abdominal wall hernia  Musculoskeletal: Normal range of motion. He exhibits no edema.  Lymphadenopathy:    He has no cervical adenopathy.  Neurological: He is alert and oriented to person, place, and time.  Skin: Skin is warm and dry. No rash noted.  Psychiatric: He has a normal mood and affect.     ED Treatments / Results  Labs (all labs ordered are listed, but only abnormal results are displayed) Labs Reviewed  COMPREHENSIVE METABOLIC PANEL - Abnormal; Notable for the following components:      Result Value   Sodium 134 (*)    Glucose, Bld 130 (*)    Calcium 8.8 (*)    AST 52 (*)    All other components within normal limits  ACETAMINOPHEN LEVEL - Abnormal; Notable for the following components:   Acetaminophen (Tylenol), Serum <10 (*)    All other components within normal limits  CBC - Abnormal; Notable for the following components:   Hemoglobin 12.4 (*)    HCT 38.6 (*)    RDW 15.9 (*)    Platelets 111 (*)    All other components within normal limits  RAPID URINE  DRUG SCREEN, HOSP PERFORMED - Abnormal; Notable for the following components:   Opiates POSITIVE (*)    Cocaine POSITIVE (*)    All other components within normal limits  ETHANOL  SALICYLATE LEVEL    EKG None  Radiology No results found.  Procedures Procedures (including critical care time)  Medications Ordered in ED Medications  albuterol (PROVENTIL HFA;VENTOLIN HFA) 108 (90 Base) MCG/ACT inhaler 2 puff (has no administration in time range)  aspirin chewable tablet 81 mg (has no administration in time range)  escitalopram (LEXAPRO) tablet 20 mg (has no administration in time range)  hydrOXYzine (ATARAX/VISTARIL) tablet 50 mg (has no administration in time range)  metoprolol succinate (TOPROL-XL) 24 hr tablet 50 mg (has no administration in time range)  QUEtiapine (SEROQUEL) tablet 100 mg (has no administration in time range)     Initial Impression / Assessment and Plan / ED Course  I have reviewed the triage vital signs and the nursing notes.  Pertinent labs & imaging results that were available during my care of the patient were reviewed by me and considered in my medical decision making (see chart for details).     Patient is medically cleared and awaiting CTS evaluation.  Final Clinical Impressions(s) / ED Diagnoses   Final diagnoses:  Suicidal ideation    ED Discharge Orders    None       Rolan Bucco, MD 10/26/17 2100

## 2017-10-26 NOTE — ED Notes (Signed)
SBAR Report received from previous nurse. Pt received calm and visible on unit. Pt denies current HI, A/V H, anxiety, or pain at this time, and appears otherwise stable and free of distress Pt endorses SI and depression pt has 1:1 sitter. Will continue to assess.

## 2017-10-26 NOTE — Progress Notes (Signed)
ADMISSION NOTE:  Pt to room 42.  Pt is 57 yo male indorsing SI and Depression that has worsened over the past several weeks.  Pt non-compliant on his depression meds for last 2 weeks.  Pt has financial difficulty in obtaining his meds and has current prescription.  Pt has extensive past medical hx.  Pt sts he used heroin the night before admission, as well as occasional cocaine use.  Pt currently denies SI, HI and AVH.  Pt verbally contracts for safety.  Pt asks for snacks and TV channel to be changed. Pt given snacks, TV channel to his choice. Pt is in bed resting and watching TV.  Pt remains safe on unit.
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
## 2017-10-26 NOTE — ED Notes (Signed)
TTS assessment in progress. 

## 2017-10-26 NOTE — ED Triage Notes (Signed)
Patient c/o worsening depression x "a few weeks." Reports SI without plan. Denies taking prescribed medications. Denies HI/A/V/H. Cooperative in triage.

## 2017-10-27 ENCOUNTER — Telehealth (HOSPITAL_COMMUNITY): Payer: Self-pay

## 2017-10-27 DIAGNOSIS — F1414 Cocaine abuse with cocaine-induced mood disorder: Secondary | ICD-10-CM | POA: Diagnosis not present

## 2017-10-27 DIAGNOSIS — R45851 Suicidal ideations: Secondary | ICD-10-CM

## 2017-10-27 DIAGNOSIS — F1910 Other psychoactive substance abuse, uncomplicated: Secondary | ICD-10-CM

## 2017-10-27 DIAGNOSIS — F17210 Nicotine dependence, cigarettes, uncomplicated: Secondary | ICD-10-CM

## 2017-10-27 NOTE — Consult Note (Addendum)
Edward Little Psychiatry Consult   Reason for Consult:  Cocaine and heroin abuse with suicidal ideations Referring Physician:  EDP Patient Identification: Edward Little MRN:  [id number] Principal Diagnosis: Cocaine abuse with cocaine-induced mood disorder [facility], [place]) Diagnosis:   Patient Active Problem List   Diagnosis Date Noted  . Cocaine abuse with cocaine-induced mood disorder [facility]) [F14.14] 10/27/2017    Priority: High  . Opioid use disorder, severe, dependence ([place]) [F11.20] 08/17/2017    Priority: High  . Sepsis ([place]) [A41.9] 09/26/2017  . Lobar pneumonia ([place]) [J18.1] 09/26/2017  . Normocytic anemia [D64.9] 09/26/2017  . Essential hypertension [I10] 09/23/2017  . Elevated troponin [R74.8] 09/23/2017  . BPH (benign prostatic hyperplasia) [N40.0] 09/23/2017    Total Time spent with patient: 45 minutes  Subjective:   Edward Little is a 57 y.o. male patient does not warrant admission.  HPI:  57 yo male who presented to the ED after using cocaine and heroin with suicidal ideations.  He was discharged from [facility] on 1/28 and went to [facility] for 2 weeks.  Clients stay at [facility] for 2-4 weeks but he claims he was released because he needed to go to a vascular surgeon before they would keep him longer.  He got a doctor's name but did not follow-up nor did he follow-up with his outpatient resources during this time.  Edward Little reports he stopped his depression medications because he could not afford it even though Beverly Sessions would have given them to him and he could afford to buy cocaine and heroin.  No suicidal/homicidal ideations today nor hallucinations or withdrawal symptoms.  Stable for discharge after Peer Support consult for additional resources.  Caveat:  Heroin charges in [place] in 06/2018.  Past Psychiatric History: substance abuse, suicidal  Risk to Self: Denies  Risk to Others: Homicidal Ideation: No(Pt denies.) Thoughts of Harm to Others: No Current Homicidal Intent: No Current  Homicidal Plan: No Access to Homicidal Means: No Identified Victim: NA History of harm to others?: No Assessment of Violence: None Noted Violent Behavior Description: NA Does patient have access to weapons?: No(Pt denies. ) Criminal Charges Pending?: Yes Describe Pending Criminal Charges: [place].  Does patient have a court date: (Pending, pt's court date was contiuned. ) Prior Inpatient Therapy: Prior Inpatient Therapy: Yes Prior Therapy Dates: Pt reported, several months ago.  Prior Therapy Facilty/Provider(s): [facility] and [facility].  Reason for Treatment: Suicidal thoughts and substance use.  Prior Outpatient Therapy: Prior Outpatient Therapy: No Does patient have an ACCT team?: No Does patient have Intensive In-House Services?  : No Does patient have [facility] services? : No Does patient have P4CC services?: No  Past Medical History:  Past Medical History:  Diagnosis Date  . AAA (abdominal aortic aneurysm) ([place])   . Abdominal hernia   . CAD (coronary artery disease)   . Cellulitis   . Chronic hepatitis C ([place])   . Cirrhosis of liver ([place])   . COPD (chronic obstructive pulmonary disease) ([place])   . Heart attack ([place])   . PVD (peripheral vascular disease) ([place])   . Spleen enlarged     Past Surgical History:  Procedure Laterality Date  . ABDOMINAL AORTIC ANEURYSM REPAIR W/ ENDOLUMINAL GRAFT    . CHOLECYSTECTOMY    . HERNIA REPAIR    . periperial stent    . revision of AAA graft     Family History:  Family History  Problem Relation Age of Onset  . Cancer - Other Mother  bone, melenoma  . Heart attack Paternal Grandfather        Died 59   Family Psychiatric  History: none Social History:  Social History   Substance and Sexual Activity  Alcohol Use No     Social History   Substance and Sexual Activity  Drug Use Yes   Comment: heroine    Social History   Socioeconomic History  . Marital status: Legally Separated    Spouse name: Not on  file  . Number of children: Not on file  . Years of education: Not on file  . Highest education level: Not on file  Occupational History  . Not on file  Social Needs  . Financial resource strain: Not on file  . Food insecurity:    Worry: Not on file    Inability: Not on file  . Transportation needs:    Medical: Not on file    Non-medical: Not on file  Tobacco Use  . Smoking status: Current Every Day Smoker    Packs/day: 1.00    Types: Cigarettes  . Smokeless tobacco: Current User  Substance and Sexual Activity  . Alcohol use: No  . Drug use: Yes    Comment: heroine  . Sexual activity: Not on file  Lifestyle  . Physical activity:    Days per week: Not on file    Minutes per session: Not on file  . Stress: Not on file  Relationships  . Social connections:    Talks on phone: Not on file    Gets together: Not on file    Attends religious service: Not on file    Active member of club or organization: Not on file    Attends meetings of clubs or organizations: Not on file    Relationship status: Not on file  Other Topics Concern  . Not on file  Social History Narrative  . Not on file   Additional Social History:    Allergies:   Allergies  Allergen Reactions  . Iodinated Diagnostic Agents Hives  . Ketorolac Tromethamine Diarrhea    Stomach cramps  . Tape Itching    Labs:  Results for orders placed or performed during the hospital encounter of 10/26/17 (from the past 48 hour(s))  Comprehensive metabolic panel     Status: Abnormal   Collection Time: 10/26/17  6:18 PM  Result Value Ref Range   Sodium 134 (L) 135 - 145 mmol/L   Potassium 3.6 3.5 - 5.1 mmol/L   Chloride 102 101 - 111 mmol/L   CO2 23 22 - 32 mmol/L   Glucose, Bld 130 (H) 65 - 99 mg/dL   BUN 7 6 - 20 mg/dL   Creatinine, Ser 0.68 0.61 - 1.24 mg/dL   Calcium 8.8 (L) 8.9 - 10.3 mg/dL   Total Protein 7.6 6.5 - 8.1 g/dL   Albumin 3.5 3.5 - 5.0 g/dL   AST 52 (H) 15 - 41 U/L   ALT 58 17 - 63 U/L    Alkaline Phosphatase 115 38 - 126 U/L   Total Bilirubin 0.7 0.3 - 1.2 mg/dL   GFR calc non Af Amer >60 >60 mL/min   GFR calc Af Amer >60 >60 mL/min    Comment: (NOTE) The eGFR has been calculated using the CKD EPI equation. This calculation has not been validated in all clinical situations. eGFR's persistently <60 mL/min signify possible Chronic Kidney Disease.    Anion gap 9 5 - 15    Comment: Performed at [facility], [place] [address] [place], [place] [address]  Ethanol     Status: None   Collection Time: 10/26/17  6:18 PM  Result Value Ref Range   Alcohol, Ethyl (B) <10 <10 mg/dL    Comment:        LOWEST DETECTABLE LIMIT FOR SERUM ALCOHOL IS 10 mg/dL FOR MEDICAL PURPOSES ONLY Performed at [place] [address], [place], [place] [address]   Salicylate level     Status: None   Collection Time: 10/26/17  6:18 PM  Result Value Ref Range   Salicylate Lvl <6.9 2.8 - 30.0 mg/dL    Comment: Performed at [facility], [place] [address] [place], [place] [address]  Acetaminophen level     Status: Abnormal   Collection Time: 10/26/17  6:18 PM  Result Value Ref Range   Acetaminophen (Tylenol), Serum <10 (L) 10 - 30 ug/mL    Comment:        THERAPEUTIC CONCENTRATIONS VARY SIGNIFICANTLY. A RANGE OF 10-30 ug/mL MAY BE AN EFFECTIVE CONCENTRATION FOR MANY PATIENTS. HOWEVER, SOME ARE BEST TREATED AT CONCENTRATIONS OUTSIDE THIS RANGE. ACETAMINOPHEN CONCENTRATIONS >150 ug/mL AT 4 HOURS AFTER INGESTION AND >50 ug/mL AT 12 HOURS AFTER INGESTION ARE OFTEN ASSOCIATED WITH TOXIC REACTIONS. Performed at [facility], [place] [address], [place], [place] [address]   cbc     Status: Abnormal   Collection Time: 10/26/17  6:18 PM  Result Value Ref Range   WBC 6.3 4.0 - 10.5 K/uL   RBC 4.63 4.22 - 5.81 MIL/uL   Hemoglobin 12.4 (L) 13.0 - 17.0 g/dL   HCT 38.6 (L) 39.0 - 52.0 %   MCV 83.4 78.0 - 100.0 fL   MCH 26.8  26.0 - 34.0 pg   MCHC 32.1 30.0 - 36.0 g/dL   RDW 15.9 (H) 11.5 - 15.5 %   Platelets 111 (L) 150 - 400 K/uL    Comment: REPEATED TO VERIFY SPECIMEN CHECKED FOR CLOTS PLATELET COUNT CONFIRMED BY SMEAR Performed at [facility] [address], [place], [place] [address]   Rapid urine drug screen (hospital performed)     Status: Abnormal   Collection Time: 10/26/17  7:18 PM  Result Value Ref Range   Opiates POSITIVE (A) NONE DETECTED   Cocaine POSITIVE (A) NONE DETECTED   Benzodiazepines NONE DETECTED NONE DETECTED   Amphetamines NONE DETECTED NONE DETECTED   Tetrahydrocannabinol NONE DETECTED NONE DETECTED   Barbiturates NONE DETECTED NONE DETECTED    Comment: (NOTE) DRUG SCREEN FOR MEDICAL PURPOSES ONLY.  IF CONFIRMATION IS NEEDED FOR ANY PURPOSE, NOTIFY LAB WITHIN 5 DAYS. LOWEST DETECTABLE LIMITS FOR URINE DRUG SCREEN Drug Class                     Cutoff (ng/mL) Amphetamine and metabolites    1000 Barbiturate and metabolites    200 Benzodiazepine                 182 Tricyclics and metabolites     300 Opiates and metabolites        300 Cocaine and metabolites        300 THC                            50 Performed at [facility], [place] [address] [place], [place] [address]     Current Facility-Administered Medications  Medication Dose Route Frequency Provider  Last Rate Last Dose  . albuterol (PROVENTIL HFA;VENTOLIN HFA) 108 (90 Base) MCG/ACT inhaler 2 puff  2 puff Inhalation Q4H PRN Malvin Johns, MD      . aspirin chewable tablet 81 mg  81 mg Oral Daily Malvin Johns, MD   81 mg at 10/26/17 2155  . escitalopram (LEXAPRO) tablet 20 mg  20 mg Oral Daily Malvin Johns, MD   20 mg at 10/26/17 2154  . hydrOXYzine (ATARAX/VISTARIL) tablet 50 mg  50 mg Oral Q6H PRN Malvin Johns, MD      . metoprolol succinate (TOPROL-XL) 24 hr tablet 50 mg  50 mg Oral Daily Malvin Johns, MD   50 mg at 10/26/17 2155  . QUEtiapine (SEROQUEL) tablet 100  mg  100 mg Oral QHS Malvin Johns, MD   100 mg at 10/26/17 2155   Current Outpatient Medications  Medication Sig Dispense Refill  . albuterol (PROVENTIL HFA;VENTOLIN HFA) 108 (90 Base) MCG/ACT inhaler Inhale 2 puffs into the lungs every 4 (four) hours as needed for wheezing. 18 g 3  . aspirin 81 MG chewable tablet Chew 1 tablet (81 mg total) by mouth daily. For heart health 30 tablet 0  . Carboxymethylcellulose Sodium (EYE DROPS OP) Place 1 drop into both eyes daily as needed (DRY, ITCHY EYES).    Marland Kitchen escitalopram (LEXAPRO) 20 MG tablet Take 1 tablet (20 mg total) by mouth daily. For depression 30 tablet 0  . hydrOXYzine (ATARAX/VISTARIL) 50 MG tablet Take 1 tablet (50 mg total) by mouth every 6 (six) hours as needed for anxiety. 75 tablet 0  . ibuprofen (ADVIL,MOTRIN) 200 MG tablet Take 600 mg by mouth daily as needed.    . metoprolol succinate (TOPROL-XL) 50 MG 24 hr tablet Take 50 mg by mouth daily. Take with or immediately following a meal.    . ondansetron (ZOFRAN-ODT) 4 MG disintegrating tablet Take 1 tablet (4 mg total) by mouth every 6 (six) hours as needed for nausea or vomiting. 60 tablet 0  . QUEtiapine (SEROQUEL) 100 MG tablet Take 1 tablet (100 mg total) by mouth at bedtime. For mood control 30 tablet 0  . fluticasone furoate-vilanterol (BREO ELLIPTA) 200-25 MCG/INH AEPB Inhale 1 puff into the lungs daily. (Patient not taking: Reported on 10/26/2017) 28 each 0  . guaiFENesin-dextromethorphan (ROBITUSSIN DM) 100-10 MG/5ML syrup Take 5 mLs by mouth every 6 (six) hours. (Patient not taking: Reported on 10/26/2017) 118 mL 0  . nicotine (NICODERM CQ - DOSED IN MG/24 HOURS) 21 mg/24hr patch Place 1 patch (21 mg total) onto the skin daily. For smoking cessation (Patient not taking: Reported on 10/26/2017) 28 patch 0  . predniSONE (DELTASONE) 10 MG tablet Take 4 tablets for 3 days; Take 3 tablets for 4 days; Take 2 tablets for 3 days; Take 1 tablet for 4 days (Patient not taking: Reported on 10/26/2017)  34 tablet 0  . tamsulosin (FLOMAX) 0.4 MG CAPS capsule Take 1 capsule (0.4 mg total) by mouth daily. For prostate health (Patient not taking: Reported on 10/26/2017) 30 capsule 0    Musculoskeletal: Strength & Muscle Tone: within normal limits Gait & Station: normal Patient leans: N/A  Psychiatric Specialty Exam: Physical Exam  Constitutional: He is oriented to person, place, and time. He appears well-developed and well-nourished.  HENT:  Head: Normocephalic.  Neck: Normal range of motion.  Respiratory: Effort normal.  Musculoskeletal: Normal range of motion.  Neurological: He is alert and oriented to person, place, and time.  Psychiatric: He has a normal mood and  affect. His speech is normal and behavior is normal. Judgment and thought content normal. Cognition and memory are normal.    Review of Systems  Psychiatric/Behavioral: Positive for substance abuse.  All other systems reviewed and are negative.   Blood pressure (!) 158/81, pulse 68, temperature 98.2 F (36.8 C), temperature source Oral, resp. rate 18, SpO2 97 %.There is no height or weight on file to calculate BMI.  General Appearance: Casual  Eye Contact:  Good  Speech:  Normal Rate  Volume:  Normal  Mood:  Irritable  Affect:  Congruent  Thought Process:  Coherent and Descriptions of Associations: Intact  Orientation:  Full (Time, Place, and Person)  Thought Content:  WDL and Logical  Suicidal Thoughts:  No  Homicidal Thoughts:  No  Memory:  Immediate;   Good Recent;   Good Remote;   Good  Judgement:  Fair  Insight:  Fair  Psychomotor Activity:  Normal  Concentration:  Concentration: Good and Attention Span: Good  Recall:  Good  Fund of Knowledge:  Fair  Language:  Good  Akathisia:  No  Handed:  Right  AIMS (if indicated):     Assets:  Leisure Time Physical Health Resilience  ADL's:  Intact  Cognition:  WNL  Sleep:        Treatment Plan Summary: Daily contact with patient to assess and evaluate  symptoms and progress in treatment, Medication management and Plan cocaine abuse with cocaine induced mood disorder:  -Crisis stabilization -Medication management:  Medical medications restarted along with Lexapro 20 mg daily for depression, hydroxyzine 25 mg every 6 hours PRN anxiety, and Seroquel 100 mg at bedtime for mood stabilization and sleep -Individual counseling  Disposition: No evidence of imminent risk to self or others at present.    Edward Boga, NP 10/27/2017 10:31 AM  Patient seen face-to-face for psychiatric evaluation, chart reviewed and case discussed with the physician extender and developed treatment plan. Reviewed the information documented and agree with the treatment plan. Edward Pilgrim, MD

## 2017-10-27 NOTE — [facility] Suicide Risk Assessment (Signed)
Suicide Risk Assessment  Discharge Assessment   [facility]   Principal Problem: Cocaine abuse with cocaine-induced mood disorder [facility]) Discharge Diagnoses:  Patient Active Problem List   Diagnosis Date Noted  . Cocaine abuse with cocaine-induced mood disorder [facility](HCC) [F14.14] 10/27/2017    Priority: High  . Opioid use disorder, severe, dependence ([facility]) [F11.20] 08/17/2017    Priority: High  . Sepsis ([facility]) [A41.9] 09/26/2017  . Lobar pneumonia ([facility]) [J18.1] 09/26/2017  . Normocytic anemia [D64.9] 09/26/2017  . Essential hypertension [I10] 09/23/2017  . Elevated troponin [R74.8] 09/23/2017  . BPH (benign prostatic hyperplasia) [N40.0] 09/23/2017    Total Time spent with patient: 45 minutes  Musculoskeletal: Strength & Muscle Tone: within normal limits Gait & Station: normal Patient leans: N/A  Psychiatric Specialty Exam: Physical Exam  Constitutional: He is oriented to person, place, and time. He appears well-developed and well-nourished.  HENT:  Head: Normocephalic.  Neck: Normal range of motion.  Respiratory: Effort normal.  Musculoskeletal: Normal range of motion.  Neurological: He is alert and oriented to person, place, and time.  Psychiatric: He has a normal mood and affect. His speech is normal and behavior is normal. Judgment and thought content normal. Cognition and memory are normal.    Review of Systems  Psychiatric/Behavioral: Positive for substance abuse.  All other systems reviewed and are negative.   Blood pressure (!) 158/81, pulse 68, temperature 98.2 F (36.8 C), temperature source Oral, resp. rate 18, SpO2 97 %.There is no height or weight on file to calculate BMI.  General Appearance: Casual  Eye Contact:  Good  Speech:  Normal Rate  Volume:  Normal  Mood:  Irritable  Affect:  Congruent  Thought Process:  Coherent and Descriptions of Associations: Intact  Orientation:  Full (Time, Place, and Person)  Thought Content:  WDL  and Logical  Suicidal Thoughts:  No  Homicidal Thoughts:  No  Memory:  Immediate;   Good Recent;   Good Remote;   Good  Judgement:  Fair  Insight:  Fair  Psychomotor Activity:  Normal  Concentration:  Concentration: Good and Attention Span: Good  Recall:  Good  Fund of Knowledge:  Fair  Language:  Good  Akathisia:  No  Handed:  Right  AIMS (if indicated):     Assets:  Leisure Time Physical Health Resilience  ADL's:  Intact  Cognition:  WNL  Sleep:       Mental Status Per Nursing Assessment::   On Admission:   cocaine and heroin abuse with suicidal ideations  Demographic Factors:  Male and Caucasian  Loss Factors: Legal issues  Historical Factors: NA  Risk Reduction Factors:   Sense of responsibility to family, Positive social support and Positive therapeutic relationship  Continued Clinical Symptoms:  Irritable  Cognitive Features That Contribute To Risk:  None    Suicide Risk:  Minimal: No identifiable suicidal ideation.  Patients presenting with no risk factors but with morbid ruminations; may be classified as minimal risk based on the severity of the depressive symptoms    Plan Of Care/Follow-up recommendations:  Activity:  as tolerated Diet:  heart healthy diet  Edward Koegel, NP 10/27/2017, 10:46 AM

## 2017-10-27 NOTE — Patient Outreach (Signed)
ED Peer Support Specialist Patient Intake (Complete at intake & 30-60 Day Follow-up)  Name: Shabazz Mckey  MRN: [id number]  Age: 57 y.o.   Date of Admission: 10/27/2017  Intake: Initial Comments:      Primary Reason Admitted: depression, SI, chronic pain, poly substance use with cocaine and heroin   Lab values: Alcohol/ETOH: Negative Positive UDS? Yes Amphetamines: No Barbiturates: No Benzodiazepines: No Cocaine: Yes Opiates: Yes Cannabinoids: No  Demographic information: Gender: Male Ethnicity: (P) White Marital Status: Married(in the process of getting divorced ) Insurance Status: Marketing executive (Work Neurosurgeon, Physicist, medical, etc.: Yes(Disability) Lives with: Other (comment)(Group Home) Living situation: Group home  Reported Patient History: Patient reported health conditions: COPD, Other (comment)(Cellulitis, Cirrhosis of liver, PVD, AAA) Patient aware of HIV and hepatitis status: Yes (comment)(Hepatitis C)  In past year, has patient visited ED for any reason? Yes  Number of ED visits: 1  Reason(s) for visit: peneumonia  In past year, has patient been hospitalized for any reason? No  Number of hospitalizations:    Reason(s) for hospitalization:    In past year, has patient been arrested? Yes  Number of arrests: 1  Reason(s) for arrest: posession of heroin  In past year, has patient been incarcerated? Yes  Number of incarcerations: 1  Reason(s) for incarceration: posession of heroin   In past year, has patient received medication-assisted treatment? No  In past year, patient received the following treatments: Residential treatment (non-hospital)([facility] 3 months ago)  In past year, has patient received any harm reduction services? No  Did this include any of the following?    In past year, has patient received care from a mental health provider for diagnosis other than SUD? No  In past year, is this first time patient  has overdosed? (has not overdosed)  Number of past overdoses:    In past year, is this first time patient has been hospitalized for an overdose? (has not overdosed)  Number of hospitalizations for overdose(s):    Is patient currently receiving treatment for a mental health diagnosis? No  Patient reports experiencing difficulty participating in SUD treatment: (P) No    Most important reason(s) for this difficulty? Transportation/Distance to provider, Cost or financial reasons, Treatment access  Has patient received prior services for treatment? Yes([facility] 3 months ago)  In past, patient has received services from following agencies: (P) ([facility] 3 months ago)  Plan of Care:  Suggested follow up at these agencies/treatment centers: (Patient is interested in opiate detox and getting in contact with [facility]. )  Other information: [facility] met with the patient and provided substance use recovery support. Patient is interested in opiate detox. [facility]PSS talked to the patient about [facility]. The patient currently does not have access to transportation. CPSS provided a couple bus passes for the patient. CPSS encouraged the patient to get in contact with [facility] and stay in contact with [facility] for substance use recovery support and help with resources. CPSS also provided resources with a NA meeting list and a list of other residential/outpatient substance use treatment centers in the area. CPSS also encouraged the patient to go to an NA meeting for supports and help with transportation for substance use recovery resources.    Mason Jim, CPSS  10/27/2017 12:10 PM

## 2017-11-15 ENCOUNTER — Other Ambulatory Visit: Payer: Self-pay | Admitting: Family Medicine

## 2017-11-21 ENCOUNTER — Encounter (HOSPITAL_COMMUNITY): Payer: Self-pay | Admitting: Emergency Medicine

## 2017-11-21 ENCOUNTER — Other Ambulatory Visit: Payer: Self-pay

## 2017-11-21 DIAGNOSIS — F17210 Nicotine dependence, cigarettes, uncomplicated: Secondary | ICD-10-CM | POA: Insufficient documentation

## 2017-11-21 DIAGNOSIS — J449 Chronic obstructive pulmonary disease, unspecified: Secondary | ICD-10-CM | POA: Insufficient documentation

## 2017-11-21 DIAGNOSIS — Z79899 Other long term (current) drug therapy: Secondary | ICD-10-CM | POA: Diagnosis not present

## 2017-11-21 DIAGNOSIS — I10 Essential (primary) hypertension: Secondary | ICD-10-CM | POA: Insufficient documentation

## 2017-11-21 DIAGNOSIS — I259 Chronic ischemic heart disease, unspecified: Secondary | ICD-10-CM | POA: Insufficient documentation

## 2017-11-21 DIAGNOSIS — Z7982 Long term (current) use of aspirin: Secondary | ICD-10-CM | POA: Diagnosis not present

## 2017-11-21 DIAGNOSIS — N3001 Acute cystitis with hematuria: Secondary | ICD-10-CM | POA: Insufficient documentation

## 2017-11-21 DIAGNOSIS — R21 Rash and other nonspecific skin eruption: Secondary | ICD-10-CM | POA: Diagnosis not present

## 2017-11-21 DIAGNOSIS — R319 Hematuria, unspecified: Secondary | ICD-10-CM | POA: Diagnosis present

## 2017-11-21 NOTE — ED Triage Notes (Signed)
Pt reports having blood in urine for the last several days. Pt states he may still have a kidney infection and has been feeling extremely tired for the last few days.

## 2017-11-22 ENCOUNTER — Emergency Department (HOSPITAL_COMMUNITY)
Admission: EM | Admit: 2017-11-22 | Discharge: 2017-11-22 | Disposition: A | Discharge status: Home / Self Care | Payer: Medicare Other | Attending: Emergency Medicine | Admitting: Emergency Medicine

## 2017-11-22 DIAGNOSIS — R21 Rash and other nonspecific skin eruption: Secondary | ICD-10-CM

## 2017-11-22 DIAGNOSIS — N3001 Acute cystitis with hematuria: Secondary | ICD-10-CM

## 2017-11-22 LAB — URINALYSIS, ROUTINE W REFLEX MICROSCOPIC
Bilirubin Urine: NEGATIVE
GLUCOSE, UA: NEGATIVE mg/dL
Hgb urine dipstick: NEGATIVE
Ketones, ur: NEGATIVE mg/dL
Nitrite: NEGATIVE
PH: 6 (ref 5.0–8.0)
PROTEIN: NEGATIVE mg/dL
SPECIFIC GRAVITY, URINE: 1.009 (ref 1.005–1.030)
WBC, UA: >50 WBC/hpf — ABNORMAL HIGH (ref 0–5)

## 2017-11-22 MED ORDER — CEPHALEXIN 500 MG PO CAPS
500.0000 mg | ORAL_CAPSULE | Freq: Once | ORAL | Status: AC
  Administered 2017-11-22: 500 mg via ORAL
  Filled 2017-11-22: qty 1

## 2017-11-22 MED ORDER — CEPHALEXIN 500 MG PO CAPS: 500.0000 mg | ORAL_CAPSULE | Freq: Four times a day (QID) | ORAL | 0 refills | Status: AC

## 2017-11-22 NOTE — ED Provider Notes (Signed)
[facility]-EMERGENCY DEPT Provider Note   CSN: [id number] Arrival date & time: 11/21/17  2247     History   Chief Complaint Chief Complaint  Patient presents with  . Hematuria    HPI Edward Little is a 57 y.o. male.  HPI   He complains of blood in urine for several days, and general weakness.  He also has dysuria, and urinary frequency.  He denies fever, chills, nausea, vomiting, focal weakness or paresthesia.  No chronic urologic disorders.  He is taking his usual medications as directed.  There are no other known modifying factors.     Past Medical History:  Diagnosis Date  . AAA (abdominal aortic aneurysm) (HCC)   . Abdominal hernia   . CAD (coronary artery disease)   . Cellulitis   . Chronic hepatitis C (HCC)   . Cirrhosis of liver (HCC)   . COPD (chronic obstructive pulmonary disease) (HCC)   . Heart attack (HCC)   . PVD (peripheral vascular disease) (HCC)   . Spleen enlarged     Patient Active Problem List   Diagnosis Date Noted  . Cocaine abuse with cocaine-induced mood disorder (HCC) 10/27/2017  . Sepsis (HCC) 09/26/2017  . Lobar pneumonia (HCC) 09/26/2017  . Normocytic anemia 09/26/2017  . Essential hypertension 09/23/2017  . Elevated troponin 09/23/2017  . BPH (benign prostatic hyperplasia) 09/23/2017  . Opioid use disorder, severe, dependence (HCC) 08/17/2017    Past Surgical History:  Procedure Laterality Date  . ABDOMINAL AORTIC ANEURYSM REPAIR W/ ENDOLUMINAL GRAFT    . CHOLECYSTECTOMY    . HERNIA REPAIR    . periperial stent    . revision of AAA graft          Home Medications    Prior to Admission medications   Medication Sig Start Date End Date Taking? Authorizing Provider  aspirin 81 MG chewable tablet Chew 1 tablet (81 mg total) by mouth daily. For heart health 08/21/17   Armandina Stammer I, NP  cephALEXin (KEFLEX) 500 MG capsule Take 1 capsule (500 mg total) by mouth 4 (four) times daily. 11/22/17   Mancel Bale, MD    escitalopram (LEXAPRO) 20 MG tablet Take 1 tablet (20 mg total) by mouth daily. For depression 08/21/17   Armandina Stammer I, NP  fluticasone furoate-vilanterol (BREO ELLIPTA) 200-25 MCG/INH AEPB Inhale 1 puff into the lungs daily. Patient not taking: Reported on 10/26/2017 09/28/17   Randel Pigg, Dorma Russell, MD  hydrOXYzine (ATARAX/VISTARIL) 50 MG tablet Take 1 tablet (50 mg total) by mouth every 6 (six) hours as needed for anxiety. 08/20/17   Armandina Stammer I, NP  ibuprofen (ADVIL,MOTRIN) 200 MG tablet Take 600 mg by mouth daily as needed.    [provider]  metoprolol succinate (TOPROL-XL) 50 MG 24 hr tablet Take 50 mg by mouth daily. Take with or immediately following a meal.    [provider]  QUEtiapine (SEROQUEL) 100 MG tablet Take 1 tablet (100 mg total) by mouth at bedtime. For mood control 08/20/17   Armandina Stammer I, NP  tamsulosin (FLOMAX) 0.4 MG CAPS capsule Take 1 capsule (0.4 mg total) by mouth daily. For prostate health Patient not taking: Reported on 10/26/2017 08/21/17   Sanjuana Kava, NP    Family History Family History  Problem Relation Age of Onset  . Cancer - Other Mother        bone, melenoma  . Heart attack Paternal Grandfather        Died 15  Social History Social History   Tobacco Use  . Smoking status: Current Every Day Smoker    Packs/day: 1.00    Types: Cigarettes  . Smokeless tobacco: Current User  Substance Use Topics  . Alcohol use: No  . Drug use: Yes    Comment: heroine     Allergies   Iodinated diagnostic agents and Ketorolac tromethamine   Review of Systems Review of Systems  All other systems reviewed and are negative.    Physical Exam Updated Vital Signs BP (!) 180/85 (BP Location: Left Arm)   Pulse 91   Temp 98.7 F (37.1 C) (Oral)   Resp 18   Ht  (1.854 m)   Wt 81.6 kg (180 lb)   SpO2 98%   BMI 23.75 kg/m   Physical Exam  Constitutional: He is oriented to person, place, and time. He appears well-developed and  well-nourished. No distress.  HENT:  Head: Normocephalic and atraumatic.  Right Ear: External ear normal.  Left Ear: External ear normal.  Eyes: Pupils are equal, round, and reactive to light. Conjunctivae and EOM are normal.  Neck: Normal range of motion and phonation normal. Neck supple.  Cardiovascular: Normal rate, regular rhythm and normal heart sounds.  Pulmonary/Chest: Effort normal and breath sounds normal. He exhibits no bony tenderness.  Abdominal: Soft. There is no tenderness.  Genitourinary:  Genitourinary Comments: No urethral discharge.  Normal penis, scrotum and scrotal contents.  No groin hernia.  Musculoskeletal: Normal range of motion.  Neurological: He is alert and oriented to person, place, and time. No cranial nerve deficit or sensory deficit. He exhibits normal muscle tone. Coordination normal.  Skin: Skin is warm, dry and intact.  Psychiatric: He has a normal mood and affect. His behavior is normal. Judgment and thought content normal.  Nursing note and vitals reviewed.    ED Treatments / Results  Labs (all labs ordered are listed, but only abnormal results are displayed) Labs Reviewed  URINALYSIS, ROUTINE W REFLEX MICROSCOPIC - Abnormal; Notable for the following components:      Result Value   Leukocytes, UA LARGE (*)    WBC, UA >50 (*)    Bacteria, UA RARE (*)    All other components within normal limits    EKG None  Radiology No results found.  Procedures Procedures (including critical care time)  Medications Ordered in ED Medications  cephALEXin (KEFLEX) capsule 500 mg (has no administration in time range)     Initial Impression / Assessment and Plan / ED Course  I have reviewed the triage vital signs and the nursing notes.  Pertinent labs & imaging results that were available during my care of the patient were reviewed by me and considered in my medical decision making (see chart for details).      Patient Vitals for the past 24  hrs:  BP Temp Temp src Pulse Resp SpO2 Height Weight  11/21/17 2322 - - - - - -  (1.854 m) 81.6 kg (180 lb)  11/21/17 2313 (!) 180/85 98.7 F (37.1 C) Oral 91 18 98 % - -    2:18 AM Reevaluation with update and discussion. After initial assessment and treatment, an updated evaluation reveals he remains comfortable and complains of a rash on his left lower leg.  Left lower leg has a mild erythematous excoriation, just above the ankle.  No associated bleeding, induration or fluctuance.  Findings discussed with patient all questions answered. Mancel Bale   Medical decision making-evaluation consistent with  urinary tract infection, gross hematuria.  Nonspecific rash on right leg, most likely represents dermatitis and does not appear to have a frank infection.  Doubt cellulitis, sepsis or impending vascular collapse.  Nursing Notes Reviewed/ Care Coordinated Applicable Imaging Reviewed Interpretation of Laboratory Data incorporated into ED treatment  The patient appears reasonably screened and/or stabilized for discharge and I doubt any other medical condition or other [facility] requiring further screening, evaluation, or treatment in the ED at this time prior to discharge.  Plan: Home Medications-continue usual medications; Home Treatments-push oral fluids especially water; return here if the recommended treatment, does not improve the symptoms; Recommended follow up-PCP follow-up 1 to 2 weeks and as needed.      Final Clinical Impressions(s) / ED Diagnoses   Final diagnoses:  Acute cystitis with hematuria  Rash    ED Discharge Orders        Ordered    cephALEXin (KEFLEX) 500 MG capsule  4 times daily     11/22/17 0223       Mancel Bale, MD 11/22/17 [phone]

## 2017-11-22 NOTE — Discharge Instructions (Addendum)
Take the antibiotic as directed for urinary tract infection.  Your symptoms should gradually improve over the next few days.  The rash on your leg is nonspecific and may tend to improve with the antibiotic.  Keep the area clean with soap and water daily and try using heat on it to see if it helps.  Follow-up with your primary care doctor for checkup in 1 to 2 weeks, and as needed for problems.

## 2017-12-12 ENCOUNTER — Emergency Department (HOSPITAL_COMMUNITY)
Admission: EM | Admit: 2017-12-12 | Discharge: 2017-12-12 | Disposition: A | Discharge status: Home / Self Care | Payer: Medicare Other | Attending: Emergency Medicine | Admitting: Emergency Medicine

## 2017-12-12 ENCOUNTER — Other Ambulatory Visit: Payer: Self-pay

## 2017-12-12 ENCOUNTER — Encounter (HOSPITAL_COMMUNITY): Payer: Self-pay | Admitting: Emergency Medicine

## 2017-12-12 ENCOUNTER — Emergency Department (HOSPITAL_COMMUNITY): Payer: Medicare Other

## 2017-12-12 DIAGNOSIS — J449 Chronic obstructive pulmonary disease, unspecified: Secondary | ICD-10-CM | POA: Insufficient documentation

## 2017-12-12 DIAGNOSIS — I10 Essential (primary) hypertension: Secondary | ICD-10-CM | POA: Diagnosis not present

## 2017-12-12 DIAGNOSIS — Z7982 Long term (current) use of aspirin: Secondary | ICD-10-CM | POA: Insufficient documentation

## 2017-12-12 DIAGNOSIS — I2510 Atherosclerotic heart disease of native coronary artery without angina pectoris: Secondary | ICD-10-CM | POA: Diagnosis not present

## 2017-12-12 DIAGNOSIS — R319 Hematuria, unspecified: Secondary | ICD-10-CM

## 2017-12-12 DIAGNOSIS — Z79899 Other long term (current) drug therapy: Secondary | ICD-10-CM | POA: Insufficient documentation

## 2017-12-12 DIAGNOSIS — R109 Unspecified abdominal pain: Secondary | ICD-10-CM | POA: Insufficient documentation

## 2017-12-12 DIAGNOSIS — R300 Dysuria: Secondary | ICD-10-CM | POA: Insufficient documentation

## 2017-12-12 DIAGNOSIS — F17210 Nicotine dependence, cigarettes, uncomplicated: Secondary | ICD-10-CM | POA: Insufficient documentation

## 2017-12-12 LAB — COMPREHENSIVE METABOLIC PANEL
ALT: 70 U/L — ABNORMAL HIGH (ref 17–63)
AST: 55 U/L — ABNORMAL HIGH (ref 15–41)
Albumin: 3.9 g/dL (ref 3.5–5.0)
Alkaline Phosphatase: 104 U/L (ref 38–126)
Anion gap: 10 (ref 5–15)
BUN: 11 mg/dL (ref 6–20)
CO2: 23 mmol/L (ref 22–32)
Calcium: 9.9 mg/dL (ref 8.9–10.3)
Chloride: 101 mmol/L (ref 101–111)
Creatinine, Ser: 0.76 mg/dL (ref 0.61–1.24)
GFR calc Af Amer: >60 mL/min (ref >60)
GFR calc non Af Amer: >60 mL/min (ref >60)
Glucose, Bld: 99 mg/dL (ref 65–99)
Potassium: 4.1 mmol/L (ref 3.5–5.1)
Sodium: 134 mmol/L — ABNORMAL LOW (ref 135–145)
Total Bilirubin: 0.8 mg/dL (ref 0.3–1.2)
Total Protein: 8.7 g/dL — ABNORMAL HIGH (ref 6.5–8.1)

## 2017-12-12 LAB — URINALYSIS, ROUTINE W REFLEX MICROSCOPIC
Bilirubin Urine: NEGATIVE
Glucose, UA: NEGATIVE mg/dL
Hgb urine dipstick: NEGATIVE
Ketones, ur: NEGATIVE mg/dL
LEUKOCYTES UA: NEGATIVE
NITRITE: NEGATIVE
Protein, ur: NEGATIVE mg/dL
SPECIFIC GRAVITY, URINE: 1.013 (ref 1.005–1.030)
pH: 7 (ref 5.0–8.0)

## 2017-12-12 LAB — CBC WITH DIFFERENTIAL/PLATELET
Basophils Absolute: 0 10*3/uL (ref 0.0–0.1)
Basophils Relative: 0 %
Eosinophils Absolute: 0.3 10*3/uL (ref 0.0–0.7)
Eosinophils Relative: 3 %
HCT: 42.7 % (ref 39.0–52.0)
Hemoglobin: 14.5 g/dL (ref 13.0–17.0)
Lymphocytes Relative: 17 %
Lymphs Abs: 1.5 10*3/uL (ref 0.7–4.0)
MCH: 27.5 pg (ref 26.0–34.0)
MCHC: 34 g/dL (ref 30.0–36.0)
MCV: 80.9 fL (ref 78.0–100.0)
Monocytes Absolute: 0.6 10*3/uL (ref 0.1–1.0)
Monocytes Relative: 7 %
Neutro Abs: 6.4 10*3/uL (ref 1.7–7.7)
Neutrophils Relative %: 73 %
Platelets: 171 10*3/uL (ref 150–400)
RBC: 5.28 MIL/uL (ref 4.22–5.81)
RDW: 15 % (ref 11.5–15.5)
WBC: 8.8 10*3/uL (ref 4.0–10.5)

## 2017-12-12 MED ORDER — TRAMADOL HCL 50 MG PO TABS
50.0000 mg | ORAL_TABLET | Freq: Once | ORAL | Status: AC
  Administered 2017-12-12: 50 mg via ORAL
  Filled 2017-12-12: qty 1

## 2017-12-12 MED ORDER — TRAMADOL HCL 50 MG PO TABS: 50.0000 mg | ORAL_TABLET | Freq: Four times a day (QID) | ORAL | 0 refills | Status: AC | PRN

## 2017-12-12 MED ORDER — SODIUM CHLORIDE 0.9 % IV BOLUS
1000.0000 mL | Freq: Once | INTRAVENOUS | Status: AC
  Administered 2017-12-12: 1000 mL via INTRAVENOUS

## 2017-12-12 NOTE — ED Triage Notes (Signed)
PT states that he has hx of UTI (last one was about a month ago).  Pt states 2 days ago, he began having dysuria & hematuria with clots.  Pt states he has been able to urinate today and bleeding is better.  Pt has hypertension and has been out of his meds for the past few days.  Afebrile.  NAD.

## 2017-12-12 NOTE — Discharge Instructions (Signed)
Your testing tonight did not show any significant abnormalities and no signs of infection in the urine.  There is concern though with your symptoms that need to be closely followed up by the urologist.  Call them and make an appointment as soon as possible.

## 2017-12-13 ENCOUNTER — Ambulatory Visit: Payer: Medicare Other | Admitting: Family Medicine

## 2017-12-13 ENCOUNTER — Emergency Department (HOSPITAL_COMMUNITY)
Admission: EM | Admit: 2017-12-13 | Discharge: 2017-12-13 | Disposition: A | Discharge status: Home / Self Care | Payer: Medicare Other | Attending: Emergency Medicine | Admitting: Emergency Medicine

## 2017-12-13 DIAGNOSIS — I2510 Atherosclerotic heart disease of native coronary artery without angina pectoris: Secondary | ICD-10-CM | POA: Diagnosis not present

## 2017-12-13 DIAGNOSIS — Z79899 Other long term (current) drug therapy: Secondary | ICD-10-CM | POA: Insufficient documentation

## 2017-12-13 DIAGNOSIS — F1414 Cocaine abuse with cocaine-induced mood disorder: Secondary | ICD-10-CM | POA: Diagnosis not present

## 2017-12-13 DIAGNOSIS — I10 Essential (primary) hypertension: Secondary | ICD-10-CM | POA: Diagnosis not present

## 2017-12-13 DIAGNOSIS — Z7982 Long term (current) use of aspirin: Secondary | ICD-10-CM | POA: Insufficient documentation

## 2017-12-13 DIAGNOSIS — I252 Old myocardial infarction: Secondary | ICD-10-CM | POA: Diagnosis not present

## 2017-12-13 DIAGNOSIS — J449 Chronic obstructive pulmonary disease, unspecified: Secondary | ICD-10-CM | POA: Insufficient documentation

## 2017-12-13 DIAGNOSIS — F1120 Opioid dependence, uncomplicated: Secondary | ICD-10-CM | POA: Diagnosis not present

## 2017-12-13 DIAGNOSIS — R45851 Suicidal ideations: Secondary | ICD-10-CM | POA: Diagnosis not present

## 2017-12-13 DIAGNOSIS — F17210 Nicotine dependence, cigarettes, uncomplicated: Secondary | ICD-10-CM | POA: Insufficient documentation

## 2017-12-13 DIAGNOSIS — L03115 Cellulitis of right lower limb: Secondary | ICD-10-CM | POA: Diagnosis not present

## 2017-12-13 LAB — RAPID URINE DRUG SCREEN, HOSP PERFORMED
Amphetamines: NOT DETECTED
Barbiturates: NOT DETECTED
Benzodiazepines: NOT DETECTED
Cocaine: POSITIVE — AB
Opiates: POSITIVE — AB
TETRAHYDROCANNABINOL: NOT DETECTED

## 2017-12-13 LAB — ACETAMINOPHEN LEVEL

## 2017-12-13 LAB — SALICYLATE LEVEL

## 2017-12-13 LAB — ETHANOL: Alcohol, Ethyl (B): <10 mg/dL (ref <10)

## 2017-12-13 MED ORDER — SULFAMETHOXAZOLE-TRIMETHOPRIM 800-160 MG PO TABS
1.0000 | ORAL_TABLET | Freq: Two times a day (BID) | ORAL | Status: DC
  Administered 2017-12-13: 1 via ORAL
  Filled 2017-12-13: qty 1

## 2017-12-13 MED ORDER — FLUTICASONE FUROATE-VILANTEROL 200-25 MCG/INH IN AEPB
1.0000 | INHALATION_SPRAY | Freq: Every day | RESPIRATORY_TRACT | Status: DC
  Administered 2017-12-13: 1 via RESPIRATORY_TRACT
  Filled 2017-12-13: qty 28

## 2017-12-13 MED ORDER — TAMSULOSIN HCL 0.4 MG PO CAPS
0.4000 mg | ORAL_CAPSULE | Freq: Every day | ORAL | Status: DC
  Administered 2017-12-13: 0.4 mg via ORAL
  Filled 2017-12-13 (×2): qty 1

## 2017-12-13 MED ORDER — METOPROLOL SUCCINATE ER 50 MG PO TB24
50.0000 mg | ORAL_TABLET | Freq: Every day | ORAL | Status: DC
  Administered 2017-12-13: 50 mg via ORAL
  Filled 2017-12-13: qty 1

## 2017-12-13 MED ORDER — ASPIRIN 81 MG PO CHEW
81.0000 mg | CHEWABLE_TABLET | Freq: Every day | ORAL | Status: DC
  Administered 2017-12-13: 81 mg via ORAL
  Filled 2017-12-13: qty 1

## 2017-12-13 MED ORDER — ESCITALOPRAM OXALATE 10 MG PO TABS
20.0000 mg | ORAL_TABLET | Freq: Every day | ORAL | Status: DC
  Administered 2017-12-13: 20 mg via ORAL
  Filled 2017-12-13: qty 2

## 2017-12-13 MED ORDER — MUPIROCIN CALCIUM 2 % EX CREA
TOPICAL_CREAM | Freq: Two times a day (BID) | CUTANEOUS | Status: DC
  Administered 2017-12-13: 10:00:00 via TOPICAL
  Filled 2017-12-13: qty 15

## 2017-12-13 MED ORDER — QUETIAPINE FUMARATE 100 MG PO TABS: 100.0000 mg | ORAL_TABLET | Freq: Every day | ORAL | Status: DC

## 2017-12-13 MED ORDER — HYDROXYZINE HCL 25 MG PO TABS: 50.0000 mg | ORAL_TABLET | Freq: Four times a day (QID) | ORAL | Status: DC | PRN

## 2017-12-13 MED ORDER — SULFAMETHOXAZOLE-TRIMETHOPRIM 800-160 MG PO TABS: 1.0000 | ORAL_TABLET | Freq: Two times a day (BID) | ORAL | 0 refills | Status: AC

## 2017-12-13 MED ORDER — GABAPENTIN 300 MG PO CAPS
300.0000 mg | ORAL_CAPSULE | Freq: Three times a day (TID) | ORAL | Status: DC
  Administered 2017-12-13: 300 mg via ORAL
  Filled 2017-12-13: qty 1

## 2017-12-13 MED ORDER — MUPIROCIN CALCIUM 2 % EX CREA: 1.0000 "application " | TOPICAL_CREAM | Freq: Two times a day (BID) | CUTANEOUS | 0 refills | Status: AC

## 2017-12-13 NOTE — BH Assessment (Signed)
Tele Assessment Note   Patient Name: Edward Little MRN: [id number] Referring Physician: Rolan Bucco, MD Location of Patient: [facility] Location of Provider: Behavioral Health TTS Department  Rochell Mabie is an 57 y.o. male rpesent to [facility] complaining of worsening depressive symptoms. Patient report, "I feel depressed. I have a lot going on." Patient did not go into detail about stressors, however, previous notes states patient is dealing with a divorce. Report he last used heroin yesterday and cocaine a few days ago. Report abusing $50 - $100 of heroin daily. Report he wants to go to [facility] treatment. Patient denies suicidal / homicidal ideations, auditory / visual hallucinations and paranoia.     Diagnosis: F33.2 Major Depressive Disorder, recurrent, severe without psychotic features.                     F11.20 Opioid use Disorder, severe.                    F14.20 Cocaine use Disorder, moderate.  Past Medical History:  Past Medical History:  Diagnosis Date  . AAA (abdominal aortic aneurysm) (HCC)   . Abdominal hernia   . CAD (coronary artery disease)   . Cellulitis   . Chronic hepatitis C (HCC)   . Cirrhosis of liver (HCC)   . COPD (chronic obstructive pulmonary disease) (HCC)   . Heart attack (HCC)   . PVD (peripheral vascular disease) (HCC)   . Spleen enlarged     Past Surgical History:  Procedure Laterality Date  . ABDOMINAL AORTIC ANEURYSM REPAIR W/ ENDOLUMINAL GRAFT    . CHOLECYSTECTOMY    . HERNIA REPAIR    . periperial stent    . revision of AAA graft      Family History:  Family History  Problem Relation Age of Onset  . Cancer - Other Mother        bone, melenoma  . Heart attack Paternal Grandfather        Died 56    Social History:  reports that he has been smoking cigarettes.  He has been smoking about 1.00 pack per day. He uses smokeless tobacco. He reports that he has current or past drug history. He reports that he does not drink  alcohol.  Additional Social History:  Alcohol / Drug Use Pain Medications: See MAR Prescriptions: See MAR Over the Counter: See MAR History of alcohol / drug use?: Yes Substance #1 Name of Substance 1: Opiates(heroin)  1 - Age of First Use: UTA 1 - Amount (size/oz): Pt reported, using $50 (.5 grams) of heroin, yesterday. 1 - Frequency: Ongoing, 1 - Duration: Ongoing.  1 - Last Use / Amount: 12/12/2017 Substance #2 Name of Substance 2: Cocaine.  2 - Age of First Use: UTA 2 - Amount (size/oz): Pt reported, using $20 of cocaine, a couple days ago.  2 - Frequency: UTA 2 - Duration: UTA 2 - Last Use / Amount: Pt reported, a couple days ago.   CIWA: CIWA-Ar BP: (!) 153/100(RN notified ) Pulse Rate: 80 COWS:    Allergies:  Allergies  Allergen Reactions  . Iodinated Diagnostic Agents Hives  . Ketorolac Tromethamine Diarrhea    Stomach cramps    Home Medications:  (Not in a hospital admission)  OB/GYN Status:  No LMP for male patient.  General Assessment Data Location of Assessment: [facility] TTS Assessment: In system Is this a Tele or Face-to-Face Assessment?: Face-to-Face Is this an Initial Assessment or a  Re-assessment for this encounter?: Initial Assessment Marital status: Married Living Arrangements: Group Home Can pt return to current living arrangement?: Yes Admission Status: Voluntary Is patient capable of signing voluntary admission?: Yes Referral Source: Self/Family/Friend Insurance type: Medicare     Crisis Care Plan Living Arrangements: Group Home Legal Guardian: Other: Name of Psychiatrist: NA Name of Therapist: NA  Education Status Is patient currently in school?: No Is the patient employed, unemployed or receiving disability?: Receiving disability income  Risk to self with the past 6 months Suicidal Ideation: No-Not Currently/Within Last 6 Months Has patient been a risk to self within the past 6 months prior to admission? : Yes Suicidal Intent:  No-Not Currently/Within Last 6 Months Has patient had any suicidal intent within the past 6 months prior to admission? : Yes Is patient at risk for suicide?: No, but patient needs Medical Clearance(Per Dr. Sharma Covert pt medically cleared, suffers w/depression) Suicidal Plan?: No Has patient had any suicidal plan within the past 6 months prior to admission? : Yes Specify Current Suicidal Plan: pt denies SI w/ plan (pt report dealing with depression ) Access to Means: No Specify Access to Suicidal Means: pt abuse drugs, has access to drugs What has been your use of drugs/alcohol within the last 12 months?: heroin and cocaine Previous Attempts/Gestures: No How many times?: 0 Other Self Harm Risks:  pt denies Triggers for Past Attempts: None known Intentional Self Injurious Behavior: None Family Suicide History: Yes Recent stressful life event(s): Divorce, Trauma (Comment), Financial Problems, Other (Comment)(drug use) Persecutory voices/beliefs?: No Depression: Yes Depression Symptoms: Feeling angry/irritable, Feeling worthless/self pity, Loss of interest in usual pleasures, Guilt, Fatigue, Despondent, Insomnia Substance abuse history and/or treatment for substance abuse?: Yes Suicide prevention information given to non-admitted patients: Not applicable  Risk to Others within the past 6 months Homicidal Ideation: No Does patient have any lifetime risk of violence toward others beyond the six months prior to admission? : No Thoughts of Harm to Others: No Current Homicidal Intent: No Current Homicidal Plan: No Access to Homicidal Means: No Identified Victim: N/A History of harm to others?: No Assessment of Violence: None Noted Violent Behavior Description: NA Does patient have access to weapons?: No Criminal Charges Pending?: No Describe Pending Criminal Charges: pt denies pending chargers Does patient have a court date: No Is patient on probation?: Yes  Psychosis Hallucinations: None  noted Delusions: None noted  Mental Status Report Appearance/Hygiene: In scrubs Eye Contact: Good Motor Activity: Freedom of movement Speech: Logical/coherent Level of Consciousness: Alert Mood: Depressed Affect: Depressed Anxiety Level: Minimal Thought Processes: Coherent, Relevant Judgement: Unimpaired Orientation: Person, Place, Time, Situation Obsessive Compulsive Thoughts/Behaviors: None  Cognitive Functioning Concentration: Normal Memory: Recent Intact, Remote Intact Is patient IDD: No Is patient DD?: No Insight: Good Impulse Control: Fair Appetite: Poor Have you had any weight changes? : No Change Sleep: Decreased Total Hours of Sleep: (pt report poor sleep, unsure of hours sleep ) Vegetative Symptoms: None  ADLScreening [facility] Assessment Services) Patient's cognitive ability adequate to safely complete daily activities?: Yes Patient able to express need for assistance with ADLs?: Yes Independently performs ADLs?: No  Prior Inpatient Therapy Prior Inpatient Therapy: Yes Prior Therapy Dates: Pt reported, several months ago.  Prior Therapy Facilty/Provider(s): [facility] and [facility].  Reason for Treatment: Suicidal thoughts and substance use.   Prior Outpatient Therapy Prior Outpatient Therapy: No Does patient have an ACCT team?: No Does patient have Intensive In-House Services?  : No Does patient have [facility] services? : No Does  patient have [facility] services?: No  ADL Screening (condition at time of admission) Patient's cognitive ability adequate to safely complete daily activities?: Yes Is the patient deaf or have difficulty hearing?: No Does the patient have difficulty seeing, even when wearing glasses/contacts?: Yes Does the patient have difficulty concentrating, remembering, or making decisions?: Yes Patient able to express need for assistance with ADLs?: Yes Does the patient have difficulty dressing or bathing?: No Independently performs ADLs?: No Is this a  change from baseline?: Pre-admission baseline Does the patient have difficulty walking or climbing stairs?: No       Abuse/Neglect Assessment (Assessment to be complete while patient is alone) Abuse/Neglect Assessment Can Be Completed: Yes Physical Abuse: Denies Verbal Abuse: Yes, past (Comment) Sexual Abuse: Denies Exploitation of patient/patient's resources: Denies Self-Neglect: Denies     Merchant [employer] officer (For Healthcare) Does Patient Have a Medical Advance Directive?: No Would patient like information on creating a medical advance directive?: No - Patient declined          Disposition:  Disposition Initial Assessment Completed for this Encounter: Yes(Dr. Wyona Almas, NP, recommend D/C)   Ocie Cornfield [facility] 12/13/2017 10:45 AM

## 2017-12-13 NOTE — BH Assessment (Signed)
[facility] Assessment Progress Note  Per Juanetta Beets, DO, this pt does not require psychiatric hospitalization at this time.  Pt is to be discharged from [facility] with recommendation to follow up with [facility] and with [facility].  This has been included in pt's discharge instructions.  Pt would also benefit from seeing Peer Support Specialists; they will be asked to speak to pt.  Pt's nurse, Toni Amend, has been notified.  Doylene Canning, MA Triage Specialist [phone]

## 2017-12-13 NOTE — ED Notes (Signed)
Peer support talking with patient.

## 2017-12-13 NOTE — Discharge Instructions (Addendum)
For your behavioral health needs, you are advised to follow up with the following providers at your earliest opportunity:       [facility]      [address]      [place], [place] [address]      [phone]      New and returning patients are seen at their walk-in clinic.  Walk-in hours are Monday - Friday from 8:00 am - 3:00 pm.  Walk-in patients are seen on a first come, first served basis.  Try to arrive as early as possible for he best chance of being seen the same day       [facility]      [address] [place], [place] [address]      [phone]

## 2017-12-13 NOTE — ED Provider Notes (Signed)
[facility] Note   CSN: [id number] Arrival date & time: 12/13/17  0747     History   Chief Complaint Chief Complaint  Patient presents with  . Suicidal    HPI Edward Little is a 57 y.o. male.  Patient is a 57 year old male with a history of coronary artery disease, COPD, peripheral vascular disease and AAA who presents with suicidal ideations.  He states his depression has continuously gotten worse and now he is having thoughts of wanting to kill himself although he does not have a specific plan.  He just feels like he needs some help with his depression.  He has some ongoing chronic health issues but denies any current symptoms.  No chest pain or shortness of breath.  No recent fevers or other recent illnesses.  He states a few days ago he was urinating some blood clots but that has resolved.  He does have a small wound which he suspects is a bug bite on his right lower leg and a burn on his left index finger.  He does report heroin use and cocaine use.  He injects into his upper extremities.  He denies any alcohol use.     Past Medical History:  Diagnosis Date  . AAA (abdominal aortic aneurysm) (HCC)   . Abdominal hernia   . CAD (coronary artery disease)   . Cellulitis   . Chronic hepatitis C (HCC)   . Cirrhosis of liver (HCC)   . COPD (chronic obstructive pulmonary disease) (HCC)   . Heart attack (HCC)   . PVD (peripheral vascular disease) (HCC)   . Spleen enlarged     Patient Active Problem List   Diagnosis Date Noted  . Cocaine abuse with cocaine-induced mood disorder (HCC) 10/27/2017  . Sepsis (HCC) 09/26/2017  . Lobar pneumonia (HCC) 09/26/2017  . Normocytic anemia 09/26/2017  . Essential hypertension 09/23/2017  . Elevated troponin 09/23/2017  . BPH (benign prostatic hyperplasia) 09/23/2017  . Opioid use disorder, severe, dependence (HCC) 08/17/2017    Past Surgical History:  Procedure Laterality Date  . ABDOMINAL  AORTIC ANEURYSM REPAIR W/ ENDOLUMINAL GRAFT    . CHOLECYSTECTOMY    . HERNIA REPAIR    . periperial stent    . revision of AAA graft          Home Medications    Prior to Admission medications   Medication Sig Start Date End Date Taking? Authorizing Provider  aspirin 81 MG chewable tablet Chew 1 tablet (81 mg total) by mouth daily. For heart health 08/21/17   Armandina Stammer I, NP  cephALEXin (KEFLEX) 500 MG capsule Take 1 capsule (500 mg total) by mouth 4 (four) times daily. Patient not taking: Reported on 12/12/2017 11/22/17   Mancel Bale, MD  escitalopram (LEXAPRO) 20 MG tablet Take 1 tablet (20 mg total) by mouth daily. For depression 08/21/17   Armandina Stammer I, NP  fluticasone furoate-vilanterol (BREO ELLIPTA) 200-25 MCG/INH AEPB Inhale 1 puff into the lungs daily. Patient not taking: Reported on 10/26/2017 09/28/17   Randel Pigg, Dorma Russell, MD  hydrOXYzine (ATARAX/VISTARIL) 50 MG tablet Take 1 tablet (50 mg total) by mouth every 6 (six) hours as needed for anxiety. 08/20/17   Armandina Stammer I, NP  ibuprofen (ADVIL,MOTRIN) 200 MG tablet Take 600 mg by mouth daily as needed (pain.).     [provider]  metoprolol succinate (TOPROL-XL) 50 MG 24 hr tablet Take 50 mg by mouth daily. Take with or immediately following a meal.  [provider]  mupirocin cream (BACTROBAN) 2 % Apply 1 application topically 2 (two) times daily. 12/13/17   Rolan Bucco, MD  QUEtiapine (SEROQUEL) 100 MG tablet Take 1 tablet (100 mg total) by mouth at bedtime. For mood control 08/20/17   Armandina Stammer I, NP  sulfamethoxazole-trimethoprim (BACTRIM DS,SEPTRA DS) 800-160 MG tablet Take 1 tablet by mouth 2 (two) times daily for 7 days. 12/13/17 12/20/17  Rolan Bucco, MD  tamsulosin (FLOMAX) 0.4 MG CAPS capsule Take 1 capsule (0.4 mg total) by mouth daily. For prostate health 08/21/17   Armandina Stammer I, NP  tetrahydrozoline 0.05 % ophthalmic solution Place 1 drop into both eyes daily as needed (dry eyes.).      [provider]  traMADol (ULTRAM) 50 MG tablet Take 1 tablet (50 mg total) by mouth every 6 (six) hours as needed for severe pain. 12/12/17   Charlestine Night, PA-C    Family History Family History  Problem Relation Age of Onset  . Cancer - Other Mother        bone, melenoma  . Heart attack Paternal Grandfather        Died 36    Social History Social History   Tobacco Use  . Smoking status: Current Every Day Smoker    Packs/day: 1.00    Types: Cigarettes  . Smokeless tobacco: Current User  Substance Use Topics  . Alcohol use: No  . Drug use: Yes    Comment: heroine     Allergies   Iodinated diagnostic agents and Ketorolac tromethamine   Review of Systems Review of Systems  Constitutional: Negative for chills, diaphoresis, fatigue and fever.  HENT: Negative for congestion, rhinorrhea and sneezing.   Eyes: Negative.   Respiratory: Negative for cough, chest tightness and shortness of breath.   Cardiovascular: Negative for chest pain and leg swelling.  Gastrointestinal: Negative for abdominal pain, blood in stool, diarrhea, nausea and vomiting.  Genitourinary: Negative for difficulty urinating, flank pain, frequency and hematuria.  Musculoskeletal: Negative for arthralgias and back pain.  Skin: Positive for wound. Negative for rash.  Neurological: Negative for dizziness, speech difficulty, weakness, numbness and headaches.  Psychiatric/Behavioral: Positive for dysphoric mood and suicidal ideas.     Physical Exam Updated Vital Signs BP (!) 176/94 (BP Location: Left Arm)   Pulse 60 Comment: 60  Temp 98.2 F (36.8 C) (Oral)   Resp 17   SpO2 99%   Physical Exam  Constitutional: He is oriented to person, place, and time. He appears well-developed and well-nourished.  HENT:  Head: Normocephalic and atraumatic.  Eyes: Pupils are equal, round, and reactive to light.  Neck: Normal range of motion. Neck supple.  Cardiovascular: Normal rate, regular rhythm  and normal heart sounds.  Pulmonary/Chest: Effort normal and breath sounds normal. No respiratory distress. He has no wheezes. He has no rales. He exhibits no tenderness.  Abdominal: Soft. Bowel sounds are normal. There is no tenderness. There is no rebound and no guarding.  Musculoskeletal: Normal range of motion. He exhibits no edema.  Patient has a small circular wound to his left index finger which patient says is from a burn.  There is minimal amount of surrounding erythema.  No streaking up the arm.  There is also a circular 2 cm area of erythema on his right proximal lower leg with a central blackened area.  There is no induration or fluctuance.  Lymphadenopathy:    He has no cervical adenopathy.  Neurological: He is alert and oriented to person,  place, and time.  Skin: Skin is warm and dry. No rash noted.  Psychiatric: He has a normal mood and affect.     ED Treatments / Results  Labs (all labs ordered are listed, but only abnormal results are displayed) Labs Reviewed  RAPID URINE DRUG SCREEN, HOSP PERFORMED - Abnormal; Notable for the following components:      Result Value   Opiates POSITIVE (*)    Cocaine POSITIVE (*)    All other components within normal limits  ACETAMINOPHEN LEVEL - Abnormal; Notable for the following components:   Acetaminophen (Tylenol), Serum <10 (*)    All other components within normal limits  ETHANOL  SALICYLATE LEVEL    EKG None  Radiology Ct Renal Stone Study  Result Date: 12/12/2017 CLINICAL DATA:  History of UTI dysuria and hematuria EXAM: CT ABDOMEN AND PELVIS WITHOUT CONTRAST TECHNIQUE: Multidetector CT imaging of the abdomen and pelvis was performed following the standard protocol without IV contrast. COMPARISON:  None. FINDINGS: Lower chest: Lung bases demonstrate atelectasis at the lingula. No acute consolidation or effusion. Heart size within normal limits. Hepatobiliary: Nodular liver contour, suspicious for cirrhosis. No focal hepatic  abnormality. Status post cholecystectomy. No biliary dilatation Pancreas: Unremarkable. No pancreatic ductal dilatation or surrounding inflammatory changes. Spleen: Enlarged, measuring 15.6 cm. Adrenals/Urinary Tract: Adrenal glands are within normal limits. No hydronephrosis. 4 mm stone mid right kidney. Subcentimeter cortical hypodense lesion mid left kidney too small to further characterize. The bladder is slightly thick walled. Stomach/Bowel: Stomach is within normal limits. Appendix appears normal. No evidence of bowel wall thickening, distention, or inflammatory changes. Vascular/Lymphatic: Aortoiliac stent. Atherosclerotic calcification. Several prominent lymph nodes in the gastrohepatic region, measuring up to 9 mm. Reproductive: Slightly enlarged with coarse calcification Other: Negative for free air or free fluid. Musculoskeletal: Degenerative changes. No acute or suspicious abnormality IMPRESSION: 1. Negative for hydronephrosis or ureteral stone. Slightly thick-walled urinary bladder, cystitis versus chronic obstruction 2. Nodular liver contour suspect for cirrhosis.  Splenomegaly. 3. Borderline to slightly enlarged upper abdominal lymph nodes Electronically Signed   By: Jasmine Pang M.D.   On: 12/12/2017 20:43    Procedures Procedures (including critical care time)  Medications Ordered in ED Medications  aspirin chewable tablet 81 mg (81 mg Oral Given 12/13/17 1016)  escitalopram (LEXAPRO) tablet 20 mg (20 mg Oral Given 12/13/17 1016)  hydrOXYzine (ATARAX/VISTARIL) tablet 50 mg (has no administration in time range)  fluticasone furoate-vilanterol (BREO ELLIPTA) 200-25 MCG/INH 1 puff (1 puff Inhalation Given 12/13/17 1253)  metoprolol succinate (TOPROL-XL) 24 hr tablet 50 mg (50 mg Oral Given 12/13/17 1016)  QUEtiapine (SEROQUEL) tablet 100 mg (has no administration in time range)  tamsulosin (FLOMAX) capsule 0.4 mg (0.4 mg Oral Given 12/13/17 1016)  mupirocin cream (BACTROBAN) 2 % ( Topical  Given 12/13/17 1016)  sulfamethoxazole-trimethoprim (BACTRIM DS,SEPTRA DS) 800-160 MG per tablet 1 tablet (1 tablet Oral Given 12/13/17 1016)  gabapentin (NEURONTIN) capsule 300 mg (300 mg Oral Given 12/13/17 1206)     Initial Impression / Assessment and Plan / ED Course  I have reviewed the triage vital signs and the nursing notes.  Pertinent labs & imaging results that were available during my care of the patient were reviewed by me and considered in my medical decision making (see chart for details).     Patient presents with suicidal ideations.  He had labs drawn about 12 hours ago so I did not repeat these.  He was in the ED last night but did not wait  to be seen.  His urinalysis done at that time was clear.  There is no hematuria or signs of infection.  I did draw a urine drug screen which was positive for opiates and cocaine.  He is otherwise medically cleared and awaiting TTS evaluation.  I did order some Bactroban ointment to place on his 2 wounds and started him on Bactrim.  His tetanus shot is up-to-date.  He will need a 10-day course of Bactrim.  Patient has been cleared by behavioral health.  He was not felt to be a threat to himself.  He is currently denying suicidal ideations.  He was discharged home in good condition.  He was given a prescription for Bactrim and Bactroban ointment to use for his wounds.  He has been seen by peers support and will go to a drug treatment program.  Return precautions were given.  Final Clinical Impressions(s) / ED Diagnoses   Final diagnoses:  Cocaine abuse with cocaine-induced mood disorder (HCC)  Opioid use disorder, severe, dependence (HCC)  Cellulitis of right lower extremity    ED Discharge Orders        Ordered    mupirocin cream (BACTROBAN) 2 %  2 times daily     12/13/17 1338    sulfamethoxazole-trimethoprim (BACTRIM DS,SEPTRA DS) 800-160 MG tablet  2 times daily     12/13/17 1338       Rolan Bucco, MD 12/13/17 1340

## 2017-12-13 NOTE — [facility] Suicide Risk Assessment (Signed)
Suicide Risk Assessment  Discharge Assessment   [facility] Discharge Suicide Risk Assessment   Principal Problem: Cocaine abuse with cocaine-induced mood disorder [facility]) Discharge Diagnoses:  Patient Active Problem List   Diagnosis Date Noted  . Cocaine abuse with cocaine-induced mood disorder [facility]) [F14.14] 10/27/2017    Priority: High  . Opioid use disorder, severe, dependence (HCC) [F11.20] 08/17/2017    Priority: High  . Sepsis (HCC) [A41.9] 09/26/2017  . Lobar pneumonia (HCC) [J18.1] 09/26/2017  . Normocytic anemia [D64.9] 09/26/2017  . Essential hypertension [I10] 09/23/2017  . Elevated troponin [R74.8] 09/23/2017  . BPH (benign prostatic hyperplasia) [N40.0] 09/23/2017    Total Time spent with patient: 45 minutes  Musculoskeletal: Strength & Muscle Tone: within normal limits Gait & Station: normal Patient leans: N/A  Psychiatric Specialty Exam:   Blood pressure (!) 153/100, pulse 80, temperature 97.7 F (36.5 C), temperature source Oral, resp. rate 16, SpO2 96 %.There is no height or weight on file to calculate BMI.  General Appearance: Disheveled  Eye Contact::  Good  Speech:  Normal Rate409  Volume:  Normal  Mood:  Irritable  Affect:  Congruent  Thought Process:  Coherent and Descriptions of Associations: Intact  Orientation:  Full (Time, Place, and Person)  Thought Content:  WDL and Logical  Suicidal Thoughts:  No  Homicidal Thoughts:  No  Memory:  Immediate;   Good Recent;   Good Remote;   Good  Judgement:  Fair  Insight:  Fair  Psychomotor Activity:  Normal  Concentration:  Good  Recall:  Good  Fund of Knowledge:Fair  Language: Good  Akathisia:  No  Handed:  Right  AIMS (if indicated):     Assets:  Leisure Time Physical Health Resilience Social Support  Sleep:     Cognition: WNL  ADL's:  Intact   Mental Status Per Nursing Assessment::   On Admission:   57 yo male who presented to the ED after abusing cocaine and heroin.  He is interested in rehab  and recovery, Peer Support consult placed.  No suicidal/homicidal ideations, hallucinations, or withdrawal symptoms.  He was at [facility] and discharge on 4/11 for a similar presentation but never followed up with his outpatient appointment.  Stable for discharge.  Demographic Factors:  Male and Caucasian  Loss Factors: NA  Historical Factors: NA  Risk Reduction Factors:   Sense of responsibility to family and Positive social support  Continued Clinical Symptoms:  Irritable  Cognitive Features That Contribute To Risk:  None    Suicide Risk:  Minimal: No identifiable suicidal ideation.  Patients presenting with no risk factors but with morbid ruminations; may be classified as minimal risk based on the severity of the depressive symptoms    Plan Of Care/Follow-up recommendations:  Activity:  as tolerated Diet:  heart healthy diet  Tanaysia Bhardwaj, NP 12/13/2017, 10:13 AM

## 2017-12-13 NOTE — Patient Outreach (Signed)
ED Peer Support Specialist Patient Intake (Complete at intake & 30-60 Day Follow-up)  Name: Edward Little  MRN: [id number]  Age: 57 y.o.   Date of Admission: 12/13/2017  Intake: Initial Comments:      Primary Reason Admitted: Cocaine Abuse HCC  Lab values: Alcohol/ETOH: Negative Positive UDS? Yes Amphetamines: No Barbiturates: No Benzodiazepines: No Cocaine: Yes Opiates: Yes Cannabinoids: No  Demographic information: Gender: Male Ethnicity: White Marital Status: Divorced Insurance Status: Uninsured/Self-pay Ecologist (Work Neurosurgeon, Physicist, medical, etc.: No Lives with: Alone Living situation: House/Apartment  Reported Patient History: Patient reported health conditions: Heart disease, Depression, COPD Patient aware of HIV and hepatitis status: Yes (comment)(Hep C)  In past year, has patient visited ED for any reason? Yes  Number of ED visits: 6  Reason(s) for visit: Various Reasons   In past year, has patient been hospitalized for any reason? Yes  Number of hospitalizations: 6  Reason(s) for hospitalization:    In past year, has patient been arrested? No  Number of arrests:    Reason(s) for arrest:    In past year, has patient been incarcerated? No  Number of incarcerations:    Reason(s) for incarceration:    In past year, has patient received medication-assisted treatment? No  In past year, patient received the following treatments: Non-residential community treatment  In past year, has patient received any harm reduction services? Yes  Did this include any of the following? Other (comment)  In past year, has patient received care from a mental health provider for diagnosis other than SUD? No  In past year, is this first time patient has overdosed? No  Number of past overdoses:    In past year, is this first time patient has been hospitalized for an overdose? No  Number of hospitalizations for overdose(s):    Is  patient currently receiving treatment for a mental health diagnosis? No  Patient reports experiencing difficulty participating in SUD treatment: No    Most important reason(s) for this difficulty?    Has patient received prior services for treatment? No  In past, patient has received services from following agencies:    Plan of Care:  Suggested follow up at these agencies/treatment centers: (RTS services)  Other information: CPSS met with Pt and was able to complete to series of questions and to understand what Pt was wanting to do at this time. CPSS was made aware that Pt was going to be able to get into RTS if he can arrive with his medications. CPSS is waiting to speak with staff to see needs to be done to assist Pt with getting to RTS.    Aaron Edelman Cathalina Barcia, CPSS  12/13/2017 11:46 AM

## 2017-12-13 NOTE — ED Triage Notes (Signed)
Pt reports feeling depressed and having SI without a plan. Pt denies HI and denies having hallucinations at this time. Pt admits to recent use of opiates. Pt calm and cooperative during assessment.

## 2017-12-19 NOTE — ED Provider Notes (Signed)
[facility]-EMERGENCY DEPT Provider Note   CSN: [id number] Arrival date & time: 12/12/17  1522     History   Chief Complaint Chief Complaint  Patient presents with  . Dysuria  . Flank Pain    HPI Edward Little is a 57 y.o. male.  HPI Patient presents to the emergency department with dysuria and flank discomfort.  The patient states that this started several days ago.  The patient states he is concerned that he has a urinary tract infection.  Patient states that nothing seems make the condition better or worse.  The patient denies chest pain, shortness of breath, headache,blurred vision, neck pain, fever, cough, weakness, numbness, dizziness, anorexia, edema, abdominal pain, nausea, vomiting, diarrhea, rash, back pain,  hematemesis, bloody stool, near syncope, or syncope. Past Medical History:  Diagnosis Date  . AAA (abdominal aortic aneurysm) (HCC)   . Abdominal hernia   . CAD (coronary artery disease)   . Cellulitis   . Chronic hepatitis C (HCC)   . Cirrhosis of liver (HCC)   . COPD (chronic obstructive pulmonary disease) (HCC)   . Heart attack (HCC)   . PVD (peripheral vascular disease) (HCC)   . Spleen enlarged     Patient Active Problem List   Diagnosis Date Noted  . Cocaine abuse with cocaine-induced mood disorder (HCC) 10/27/2017  . Sepsis (HCC) 09/26/2017  . Lobar pneumonia (HCC) 09/26/2017  . Normocytic anemia 09/26/2017  . Essential hypertension 09/23/2017  . Elevated troponin 09/23/2017  . BPH (benign prostatic hyperplasia) 09/23/2017  . Opioid use disorder, severe, dependence (HCC) 08/17/2017    Past Surgical History:  Procedure Laterality Date  . ABDOMINAL AORTIC ANEURYSM REPAIR W/ ENDOLUMINAL GRAFT    . CHOLECYSTECTOMY    . HERNIA REPAIR    . periperial stent    . revision of AAA graft          Home Medications    Prior to Admission medications   Medication Sig Start Date End Date Taking? Authorizing Provider  aspirin 81  MG chewable tablet Chew 1 tablet (81 mg total) by mouth daily. For heart health 08/21/17  Yes Armandina Stammer I, NP  escitalopram (LEXAPRO) 20 MG tablet Take 1 tablet (20 mg total) by mouth daily. For depression 08/21/17  Yes Armandina Stammer I, NP  hydrOXYzine (ATARAX/VISTARIL) 50 MG tablet Take 1 tablet (50 mg total) by mouth every 6 (six) hours as needed for anxiety. 08/20/17  Yes Armandina Stammer I, NP  ibuprofen (ADVIL,MOTRIN) 200 MG tablet Take 600 mg by mouth daily as needed (pain.).    Yes [provider]  metoprolol succinate (TOPROL-XL) 50 MG 24 hr tablet Take 50 mg by mouth daily. Take with or immediately following a meal.   Yes [provider]  QUEtiapine (SEROQUEL) 100 MG tablet Take 1 tablet (100 mg total) by mouth at bedtime. For mood control 08/20/17  Yes Armandina Stammer I, NP  tamsulosin (FLOMAX) 0.4 MG CAPS capsule Take 1 capsule (0.4 mg total) by mouth daily. For prostate health 08/21/17  Yes Armandina Stammer I, NP  tetrahydrozoline 0.05 % ophthalmic solution Place 1 drop into both eyes daily as needed (dry eyes.).    Yes [provider]  cephALEXin (KEFLEX) 500 MG capsule Take 1 capsule (500 mg total) by mouth 4 (four) times daily. Patient not taking: Reported on 12/12/2017 11/22/17   Mancel Bale, MD  fluticasone furoate-vilanterol (BREO ELLIPTA) 200-25 MCG/INH AEPB Inhale 1 puff into the lungs daily. Patient not taking: Reported on  10/26/2017 09/28/17   Lenox Ponds, MD  mupirocin cream (BACTROBAN) 2 % Apply 1 application topically 2 (two) times daily. 12/13/17   Rolan Bucco, MD  sulfamethoxazole-trimethoprim (BACTRIM DS,SEPTRA DS) 800-160 MG tablet Take 1 tablet by mouth 2 (two) times daily for 7 days. 12/13/17 12/20/17  Rolan Bucco, MD  traMADol (ULTRAM) 50 MG tablet Take 1 tablet (50 mg total) by mouth every 6 (six) hours as needed for severe pain. 12/12/17   Charlestine Night, PA-C    Family History Family History  Problem Relation Age of Onset  . Cancer -  Other Mother        bone, melenoma  . Heart attack Paternal Grandfather        Died 32    Social History Social History   Tobacco Use  . Smoking status: Current Every Day Smoker    Packs/day: 1.00    Types: Cigarettes  . Smokeless tobacco: Current User  Substance Use Topics  . Alcohol use: No  . Drug use: Yes    Comment: heroine     Allergies   Iodinated diagnostic agents and Ketorolac tromethamine   Review of Systems Review of Systems All other systems negative except as documented in the HPI. All pertinent positives and negatives as reviewed in the HPI. Physical Exam Updated Vital Signs BP (!) 207/89 (BP Location: Left Arm) Comment: Simultaneous filing. User may not have seen previous data.  Pulse 66 Comment: Simultaneous filing. User may not have seen previous data.  Temp 98 F (36.7 C) (Oral)   Resp 18   Ht  (1.854 m)   Wt 81.6 kg (180 lb)   SpO2 100% Comment: Simultaneous filing. User may not have seen previous data.  BMI 23.75 kg/m   Physical Exam  Constitutional: He is oriented to person, place, and time. He appears well-developed and well-nourished. No distress.  HENT:  Head: Normocephalic and atraumatic.  Mouth/Throat: Oropharynx is clear and moist.  Eyes: Pupils are equal, round, and reactive to light.  Neck: Normal range of motion. Neck supple.  Cardiovascular: Normal rate, regular rhythm and normal heart sounds. Exam reveals no gallop and no friction rub.  No murmur heard. Pulmonary/Chest: Effort normal and breath sounds normal. No respiratory distress. He has no wheezes.  Abdominal: Soft. Bowel sounds are normal. He exhibits no distension and no mass. There is no tenderness. There is no rebound and no guarding.  Neurological: He is alert and oriented to person, place, and time. He exhibits normal muscle tone. Coordination normal.  Skin: Skin is warm and dry. Capillary refill takes less than 2 seconds. No rash noted. No erythema.  Psychiatric: He  has a normal mood and affect. His behavior is normal.  Nursing note and vitals reviewed.    ED Treatments / Results  Labs (all labs ordered are listed, but only abnormal results are displayed) Labs Reviewed  COMPREHENSIVE METABOLIC PANEL - Abnormal; Notable for the following components:      Result Value   Sodium 134 (*)    Total Protein 8.7 (*)    AST 55 (*)    ALT 70 (*)    All other components within normal limits  URINALYSIS, ROUTINE W REFLEX MICROSCOPIC  CBC WITH DIFFERENTIAL/PLATELET    EKG None  Radiology No results found.  Procedures Procedures (including critical care time)  Medications Ordered in ED Medications  sodium chloride 0.9 % bolus 1,000 mL (0 mLs Intravenous Stopped 12/12/17 2054)  traMADol (ULTRAM) tablet 50 mg (50 mg  Oral Given 12/12/17 2240)     Initial Impression / Assessment and Plan / ED Course  I have reviewed the triage vital signs and the nursing notes.  Pertinent labs & imaging results that were available during my care of the patient were reviewed by me and considered in my medical decision making (see chart for details).     Patient with the complaint of flank pain and dysuria did have a CT done to rule out any stone or other urinary issues.  And patient at this time does not have any signs of urinary tract infection.  Patient's urine was sent for culture.  The patient is advised to follow-up with his urologist or primary doctor.  Patient agrees to plan all questions were answered.  Final Clinical Impressions(s) / ED Diagnoses   Final diagnoses:  Flank pain  Hematuria, unspecified type    ED Discharge Orders        Ordered    traMADol (ULTRAM) 50 MG tablet  Every 6 hours PRN     12/12/17 2222       Charlestine Night, PA-C 12/19/17 0047    Benjiman Core, MD 12/19/17 [phone]

## 2018-01-13 ENCOUNTER — Emergency Department (HOSPITAL_COMMUNITY)
Admission: EM | Admit: 2018-01-13 | Discharge: 2018-01-13 | Disposition: A | Discharge status: Home / Self Care | Payer: Medicare Other | Attending: Emergency Medicine | Admitting: Emergency Medicine

## 2018-01-13 ENCOUNTER — Encounter (HOSPITAL_COMMUNITY): Payer: Self-pay | Admitting: Emergency Medicine

## 2018-01-13 ENCOUNTER — Emergency Department (HOSPITAL_BASED_OUTPATIENT_CLINIC_OR_DEPARTMENT_OTHER): Payer: Medicare Other

## 2018-01-13 ENCOUNTER — Emergency Department (HOSPITAL_COMMUNITY): Payer: Medicare Other

## 2018-01-13 DIAGNOSIS — F17210 Nicotine dependence, cigarettes, uncomplicated: Secondary | ICD-10-CM | POA: Diagnosis not present

## 2018-01-13 DIAGNOSIS — M79609 Pain in unspecified limb: Secondary | ICD-10-CM | POA: Diagnosis not present

## 2018-01-13 DIAGNOSIS — Z7982 Long term (current) use of aspirin: Secondary | ICD-10-CM | POA: Diagnosis not present

## 2018-01-13 DIAGNOSIS — M79605 Pain in left leg: Secondary | ICD-10-CM | POA: Diagnosis present

## 2018-01-13 DIAGNOSIS — I2510 Atherosclerotic heart disease of native coronary artery without angina pectoris: Secondary | ICD-10-CM | POA: Diagnosis not present

## 2018-01-13 DIAGNOSIS — R609 Edema, unspecified: Secondary | ICD-10-CM | POA: Diagnosis not present

## 2018-01-13 DIAGNOSIS — Z79899 Other long term (current) drug therapy: Secondary | ICD-10-CM | POA: Diagnosis not present

## 2018-01-13 DIAGNOSIS — L03116 Cellulitis of left lower limb: Secondary | ICD-10-CM | POA: Insufficient documentation

## 2018-01-13 DIAGNOSIS — J449 Chronic obstructive pulmonary disease, unspecified: Secondary | ICD-10-CM | POA: Insufficient documentation

## 2018-01-13 LAB — CBC WITH DIFFERENTIAL/PLATELET
Abs Immature Granulocytes: 0 10*3/uL (ref 0.0–0.1)
BASOS ABS: 0 10*3/uL (ref 0.0–0.1)
BASOS PCT: 0 %
EOS PCT: 1 %
Eosinophils Absolute: 0 10*3/uL (ref 0.0–0.7)
HCT: 41.5 % (ref 39.0–52.0)
HEMOGLOBIN: 13.6 g/dL (ref 13.0–17.0)
Immature Granulocytes: 0 %
Lymphocytes Relative: 20 %
Lymphs Abs: 1.1 10*3/uL (ref 0.7–4.0)
MCH: 26.4 pg (ref 26.0–34.0)
MCHC: 32.8 g/dL (ref 30.0–36.0)
MCV: 80.6 fL (ref 78.0–100.0)
Monocytes Absolute: 0.4 10*3/uL (ref 0.1–1.0)
Monocytes Relative: 8 %
Neutro Abs: 3.8 10*3/uL (ref 1.7–7.7)
Neutrophils Relative %: 71 %
PLATELETS: 137 10*3/uL — AB (ref 150–400)
RBC: 5.15 MIL/uL (ref 4.22–5.81)
RDW: 14.9 % (ref 11.5–15.5)
WBC: 5.4 10*3/uL (ref 4.0–10.5)

## 2018-01-13 LAB — BASIC METABOLIC PANEL
ANION GAP: 10 (ref 5–15)
BUN: 10 mg/dL (ref 6–20)
CALCIUM: 9 mg/dL (ref 8.9–10.3)
CO2: 23 mmol/L (ref 22–32)
Chloride: 99 mmol/L — ABNORMAL LOW (ref 101–111)
Creatinine, Ser: 0.71 mg/dL (ref 0.61–1.24)
GFR calc Af Amer: >60 mL/min (ref >60)
Glucose, Bld: 95 mg/dL (ref 65–99)
POTASSIUM: 3.8 mmol/L (ref 3.5–5.1)
SODIUM: 132 mmol/L — AB (ref 135–145)

## 2018-01-13 MED ORDER — TRAMADOL HCL 50 MG PO TABS: 50.0000 mg | ORAL_TABLET | Freq: Four times a day (QID) | ORAL | 0 refills | Status: AC | PRN

## 2018-01-13 MED ORDER — HYDROMORPHONE HCL 1 MG/ML IJ SOLN
0.5000 mg | Freq: Once | INTRAMUSCULAR | Status: AC
  Administered 2018-01-13: 0.5 mg via INTRAVENOUS
  Filled 2018-01-13: qty 1

## 2018-01-13 MED ORDER — DOXYCYCLINE HYCLATE 100 MG PO CAPS: 100.0000 mg | ORAL_CAPSULE | Freq: Two times a day (BID) | ORAL | 0 refills | Status: AC

## 2018-01-13 MED ORDER — ONDANSETRON HCL 4 MG/2ML IJ SOLN
4.0000 mg | Freq: Once | INTRAMUSCULAR | Status: AC
  Administered 2018-01-13: 4 mg via INTRAVENOUS
  Filled 2018-01-13: qty 2

## 2018-01-13 MED ORDER — SODIUM CHLORIDE 0.9 % IV SOLN
INTRAVENOUS | Status: DC
  Administered 2018-01-13: 15:00:00 via INTRAVENOUS

## 2018-01-13 MED ORDER — VANCOMYCIN HCL IN DEXTROSE 1-5 GM/200ML-% IV SOLN
1000.0000 mg | Freq: Once | INTRAVENOUS | Status: AC
  Administered 2018-01-13: 1000 mg via INTRAVENOUS
  Filled 2018-01-13: qty 200

## 2018-01-13 NOTE — Discharge Instructions (Signed)
Work-up here x-rays of the left leg without any bony abnormalities.  No evidence of any arterial blood flow problems no evidence of any deep vein thrombosis.  Appears to be consistent with a skin infection which we call cellulitis.  Take the antibiotic as directed.  Take the tramadol as needed for pain.  Make an appointment follow-up with your doctor.  Work note provided.  Return for any new or worse symptoms.

## 2018-01-13 NOTE — ED Notes (Signed)
PT left for [place]

## 2018-01-13 NOTE — ED Triage Notes (Signed)
Pt states he has pain, warmth and redness to the left leg. Noted on assessment, no abrasions or wounds noted. Pt states he did trip a few days ago but did not feel like he injured it. Pt has hx of claudication and stents to bilateral groin.

## 2018-01-13 NOTE — ED Notes (Signed)
Patient given bag meal with drink. 

## 2018-01-13 NOTE — ED Notes (Signed)
Patient given discharge instructions and verbalized understanding.  Patient stable to discharge at this time.  Patient is alert and oriented to baseline.  No distressed noted at this time.  All belongings taken with the patient at discharge.   

## 2018-01-13 NOTE — ED Provider Notes (Signed)
[facility] EMERGENCY DEPARTMENT Provider Note   CSN: [id number] Arrival date & time: 01/13/18  1213     History   Chief Complaint Chief Complaint  Patient presents with  . Leg Pain    HPI Edward Little is a 57 y.o. male.  Patient with complaint of left leg pain since Friday.  No direct injury.  Patient states she is got some redness to his left leg and it hurts between the knee and the foot area.  Patient has had a history of peripheral vascular disease has had bypass surgery.  Patient told nurse they did trip a few days ago but he did not fall down.  He feels he did not do any injury.  Right leg is fine.  Patient denies any fevers.  No back pain.     Past Medical History:  Diagnosis Date  . AAA (abdominal aortic aneurysm) (HCC)   . Abdominal hernia   . CAD (coronary artery disease)   . Cellulitis   . Chronic hepatitis C (HCC)   . Cirrhosis of liver (HCC)   . COPD (chronic obstructive pulmonary disease) (HCC)   . Heart attack (HCC)   . PVD (peripheral vascular disease) (HCC)   . Spleen enlarged     Patient Active Problem List   Diagnosis Date Noted  . Cocaine abuse with cocaine-induced mood disorder (HCC) 10/27/2017  . Sepsis (HCC) 09/26/2017  . Lobar pneumonia (HCC) 09/26/2017  . Normocytic anemia 09/26/2017  . Essential hypertension 09/23/2017  . Elevated troponin 09/23/2017  . BPH (benign prostatic hyperplasia) 09/23/2017  . Opioid use disorder, severe, dependence (HCC) 08/17/2017    Past Surgical History:  Procedure Laterality Date  . ABDOMINAL AORTIC ANEURYSM REPAIR W/ ENDOLUMINAL GRAFT    . CHOLECYSTECTOMY    . HERNIA REPAIR    . periperial stent    . revision of AAA graft          Home Medications    Prior to Admission medications   Medication Sig Start Date End Date Taking? Authorizing Provider  aspirin 81 MG chewable tablet Chew 1 tablet (81 mg total) by mouth daily. For heart health 08/21/17  Yes Armandina Stammer I, NP    ibuprofen (ADVIL,MOTRIN) 200 MG tablet Take 600 mg by mouth daily as needed (pain.).    Yes [provider]  metoprolol succinate (TOPROL-XL) 50 MG 24 hr tablet Take 50 mg by mouth daily. Take with or immediately following a meal.   Yes [provider]  mupirocin cream (BACTROBAN) 2 % Apply 1 application topically 2 (two) times daily. 12/13/17  Yes Rolan Bucco, MD  QUEtiapine (SEROQUEL) 100 MG tablet Take 1 tablet (100 mg total) by mouth at bedtime. For mood control 08/20/17  Yes Armandina Stammer I, NP  tamsulosin (FLOMAX) 0.4 MG CAPS capsule Take 1 capsule (0.4 mg total) by mouth daily. For prostate health 08/21/17  Yes Armandina Stammer I, NP  tetrahydrozoline 0.05 % ophthalmic solution Place 1 drop into both eyes daily as needed (dry eyes.).    Yes [provider]  cephALEXin (KEFLEX) 500 MG capsule Take 1 capsule (500 mg total) by mouth 4 (four) times daily. Patient not taking: Reported on 12/12/2017 11/22/17   Mancel Bale, MD  doxycycline (VIBRAMYCIN) 100 MG capsule Take 1 capsule (100 mg total) by mouth 2 (two) times daily. 01/13/18   Vanetta Mulders, MD  escitalopram (LEXAPRO) 20 MG tablet Take 1 tablet (20 mg total) by mouth daily. For depression Patient not taking: Reported  on 01/13/2018 08/21/17   Armandina Stammer I, NP  fluticasone furoate-vilanterol (BREO ELLIPTA) 200-25 MCG/INH AEPB Inhale 1 puff into the lungs daily. Patient not taking: Reported on 10/26/2017 09/28/17   Randel Pigg, Dorma Russell, MD  hydrOXYzine (ATARAX/VISTARIL) 50 MG tablet Take 1 tablet (50 mg total) by mouth every 6 (six) hours as needed for anxiety. Patient not taking: Reported on 01/13/2018 08/20/17   Armandina Stammer I, NP  traMADol (ULTRAM) 50 MG tablet Take 1 tablet (50 mg total) by mouth every 6 (six) hours as needed for severe pain. Patient not taking: Reported on 01/13/2018 12/12/17   Charlestine Night, PA-C  traMADol (ULTRAM) 50 MG tablet Take 1 tablet (50 mg total) by mouth every 6 (six) hours as needed.  01/13/18   Vanetta Mulders, MD    Family History Family History  Problem Relation Age of Onset  . Cancer - Other Mother        bone, melenoma  . Heart attack Paternal Grandfather        Died 40    Social History Social History   Tobacco Use  . Smoking status: Current Every Day Smoker    Packs/day: 1.00    Types: Cigarettes  . Smokeless tobacco: Current User  Substance Use Topics  . Alcohol use: No  . Drug use: Yes    Comment: heroine     Allergies   Iodinated diagnostic agents and Ketorolac tromethamine   Review of Systems Review of Systems  Constitutional: Negative for fever.  HENT: Negative for congestion.   Eyes: Negative for redness.  Respiratory: Negative for shortness of breath.   Cardiovascular: Negative for chest pain.  Gastrointestinal: Negative for abdominal pain, nausea and vomiting.  Genitourinary: Negative for dysuria.  Musculoskeletal: Negative for back pain and neck pain.  Skin: Positive for rash and wound.  Neurological: Negative for syncope and headaches.  Hematological: Does not bruise/bleed easily.  Psychiatric/Behavioral: Negative for confusion.     Physical Exam Updated Vital Signs BP 127/68   Pulse 87   Temp 97.9 F (36.6 C) (Oral)   Resp 16   Ht 1.854 m (6\' 1" )   Wt 83.9 kg (185 lb)   SpO2 97%   BMI 24.41 kg/m   Physical Exam  Constitutional: He is oriented to person, place, and time. He appears well-developed and well-nourished. No distress.  HENT:  Head: Normocephalic and atraumatic.  Mouth/Throat: Oropharynx is clear and moist.  Eyes: Pupils are equal, round, and reactive to light. EOM are normal.  Neck: Neck supple.  Cardiovascular: Normal rate, regular rhythm and normal heart sounds.  Pulmonary/Chest: Effort normal and breath sounds normal.  Abdominal: Soft. Bowel sounds are normal. There is no tenderness.  Musculoskeletal: Normal range of motion. He exhibits edema. He exhibits no tenderness or deformity.  Left leg  without any deformity.  Does have some erythema to the lateral aspect of the left leg between the knee and ankle some slight swelling no pitting edema.  Dorsalis pedis pulse distally is 2+ good cap refill.  Same good 2+ dorsalis pedis pulse to the right leg as well.  Knee without effusion no erythema to the knee is some slight increased warmth where the redness is.  Neurological: He is alert and oriented to person, place, and time. No cranial nerve deficit or sensory deficit. He exhibits normal muscle tone. Coordination normal.  Skin: Skin is warm. There is erythema.  Nursing note and vitals reviewed.    ED Treatments / Results  Labs (all labs ordered  are listed, but only abnormal results are displayed) Labs Reviewed  BASIC METABOLIC PANEL - Abnormal; Notable for the following components:      Result Value   Sodium 132 (*)    Chloride 99 (*)    All other components within normal limits  CBC WITH DIFFERENTIAL/PLATELET - Abnormal; Notable for the following components:   Platelets 137 (*)    All other components within normal limits    EKG None  Radiology Dg Tibia/fibula Left  Result Date: 01/13/2018 CLINICAL DATA:  57 y/o M; pain on the lateral aspect of the left shin. No known injury. EXAM: LEFT FOOT - COMPLETE 3+ VIEW; LEFT KNEE - COMPLETE 4+ VIEW; LEFT TIBIA AND FIBULA - 2 VIEW COMPARISON:  None. FINDINGS: Left foot: There is no evidence of fracture or dislocation. There is no evidence of arthropathy or other focal bone abnormality. Lisfranc alignment is maintained. Left tibia and fibula: There is no evidence of fracture or dislocation. There is no evidence of arthropathy or other focal bone abnormality. Talar dome is intact. Ankle mortise is symmetric on these nonstress views. Left knee: There is no evidence of fracture or dislocation. There is no evidence of arthropathy or other focal bone abnormality. Knee joint spaces are well maintained. No joint effusion. IMPRESSION: No acute  fracture or dislocation identified. Electronically Signed   By: Mitzi Hansen M.D.   On: 01/13/2018 16:34   Dg Knee Complete 4 Views Left  Result Date: 01/13/2018 CLINICAL DATA:  57 y/o M; pain on the lateral aspect of the left shin. No known injury. EXAM: LEFT FOOT - COMPLETE 3+ VIEW; LEFT KNEE - COMPLETE 4+ VIEW; LEFT TIBIA AND FIBULA - 2 VIEW COMPARISON:  None. FINDINGS: Left foot: There is no evidence of fracture or dislocation. There is no evidence of arthropathy or other focal bone abnormality. Lisfranc alignment is maintained. Left tibia and fibula: There is no evidence of fracture or dislocation. There is no evidence of arthropathy or other focal bone abnormality. Talar dome is intact. Ankle mortise is symmetric on these nonstress views. Left knee: There is no evidence of fracture or dislocation. There is no evidence of arthropathy or other focal bone abnormality. Knee joint spaces are well maintained. No joint effusion. IMPRESSION: No acute fracture or dislocation identified. Electronically Signed   By: Mitzi Hansen M.D.   On: 01/13/2018 16:34   Dg Foot Complete Left  Result Date: 01/13/2018 CLINICAL DATA:  57 y/o M; pain on the lateral aspect of the left shin. No known injury. EXAM: LEFT FOOT - COMPLETE 3+ VIEW; LEFT KNEE - COMPLETE 4+ VIEW; LEFT TIBIA AND FIBULA - 2 VIEW COMPARISON:  None. FINDINGS: Left foot: There is no evidence of fracture or dislocation. There is no evidence of arthropathy or other focal bone abnormality. Lisfranc alignment is maintained. Left tibia and fibula: There is no evidence of fracture or dislocation. There is no evidence of arthropathy or other focal bone abnormality. Talar dome is intact. Ankle mortise is symmetric on these nonstress views. Left knee: There is no evidence of fracture or dislocation. There is no evidence of arthropathy or other focal bone abnormality. Knee joint spaces are well maintained. No joint effusion. IMPRESSION: No acute  fracture or dislocation identified. Electronically Signed   By: Mitzi Hansen M.D.   On: 01/13/2018 16:34    Procedures Procedures (including critical care time)  Medications Ordered in ED Medications  0.9 %  sodium chloride infusion ( Intravenous New Bag/Given 01/13/18 1448)  vancomycin (  VANCOCIN) IVPB 1000 mg/200 mL premix (0 mg Intravenous Stopped 01/13/18 1555)  ondansetron (ZOFRAN) injection 4 mg (4 mg Intravenous Given 01/13/18 1452)  HYDROmorphone (DILAUDID) injection 0.5 mg (0.5 mg Intravenous Given 01/13/18 1452)  HYDROmorphone (DILAUDID) injection 0.5 mg (0.5 mg Intravenous Given 01/13/18 1716)     Initial Impression / Assessment and Plan / ED Course  I have reviewed the triage vital signs and the nursing notes.  Pertinent labs & imaging results that were available during my care of the patient were reviewed by me and considered in my medical decision making (see chart for details).     Work-up here without any evidence of deep vein thrombosis no evidence of any bony injury of the left leg.  There is erythema to the lateral aspect of the left leg between the ankle and the knee.  No leukocytosis no fevers.  Good arterial blood flow into both legs good dorsalis pedis pulse good cap refill.  Seems to be consistent with a cellulitis.  Patient treated with a dose of vancomycin here will be continued on doxycycline and a short course of tramadol to help with pain until the antibiotics kick in.  Recommend follow-up with primary care doctor.  Patient nontoxic no acute distress.  Final Clinical Impressions(s) / ED Diagnoses   Final diagnoses:  Left leg pain  Cellulitis of left lower extremity    ED Discharge Orders        Ordered    doxycycline (VIBRAMYCIN) 100 MG capsule  2 times daily     01/13/18 1758    traMADol (ULTRAM) 50 MG tablet  Every 6 hours PRN     01/13/18 Rebbeca [id number]       Brynden Thune, MD 01/13/18 1806

## 2018-01-13 NOTE — Progress Notes (Addendum)
VASCULAR LAB PRELIMINARY  PRELIMINARY  PRELIMINARY  PRELIMINARY  Left lower extremity venous duplex completed.    Preliminary report:  There is no DVT or SVT noted in the left lower extremity.   Called report to Silvano BilisJulie  Vallery Mcdade, [facility] Hospital[facility]ANDACE, RVT 01/13/2018, 3:22 PM

## 2018-09-22 DEATH — deceased

## 2019-02-20 NOTE — [facility] Nurse Program (Unsigned)
  Dept: [facility] Nurse Program Note  Date of Encounter: 07/31/2017  Past Medical History: Past Medical History:  Diagnosis Date  . AAA (abdominal aortic aneurysm) ([place])   . Abdominal hernia   . CAD (coronary artery disease)   . Cellulitis   . Chronic hepatitis C ([place])   . Cirrhosis of liver ([place])   . COPD (chronic obstructive pulmonary disease) ([place])   . Heart attack ([place])   . PVD (peripheral vascular disease) ([place])   . Spleen enlarged     Encounter Details:Health check Lisette Abu Rn BSn CNP [phone]

## 2019-07-08 IMAGING — CT CT RENAL STONE PROTOCOL
2 of 4 series · 17 of 46 positions shown, 19 images · non-contrast
Comparison: None.

CLINICAL DATA: History of UTI dysuria and hematuria

EXAM:
CT ABDOMEN AND PELVIS WITHOUT CONTRAST
TECHNIQUE: Multidetector CT imaging of the abdomen and pelvis was performed
following the standard protocol without IV contrast.

[Series 2: axial st · axial · 0.90mm/px · z∈[-755,-375]mm · 14 of 86 slices shown, 16 images]
[im 5/86  soft-tissue]
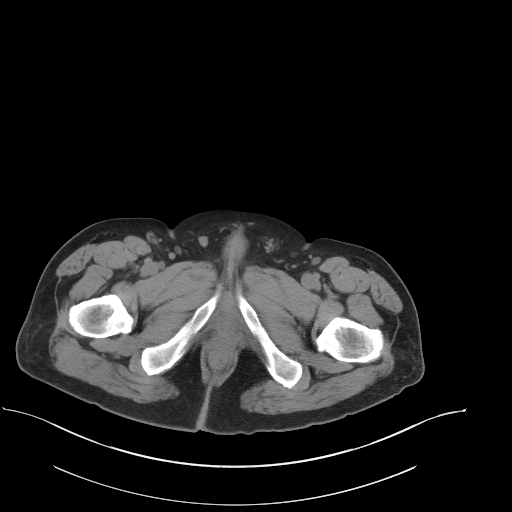
[im 5/86  bone]
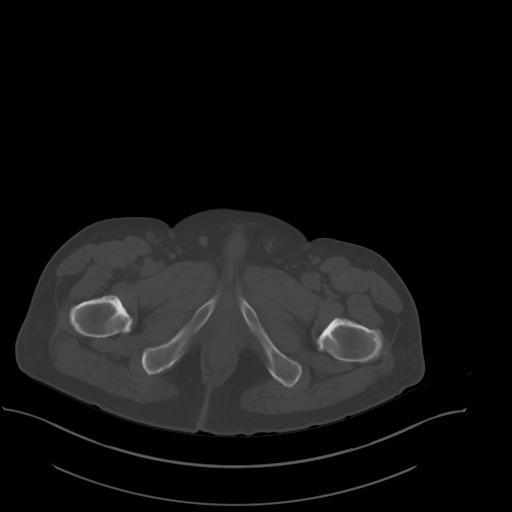
[im 9/86  soft-tissue]
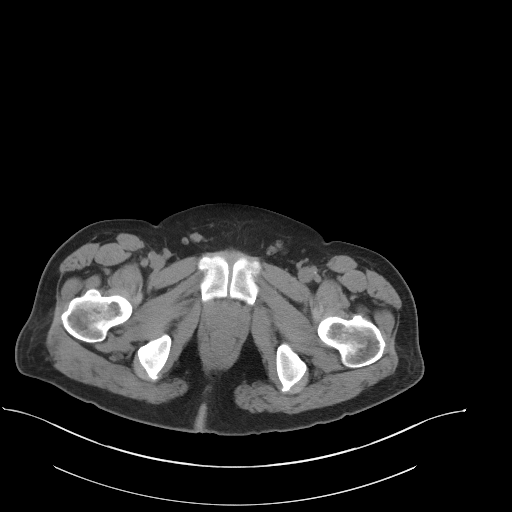
[im 18/86  soft-tissue]
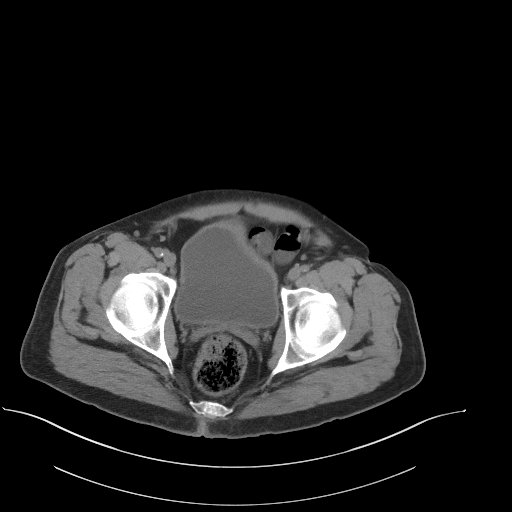
[im 23/86  soft-tissue]
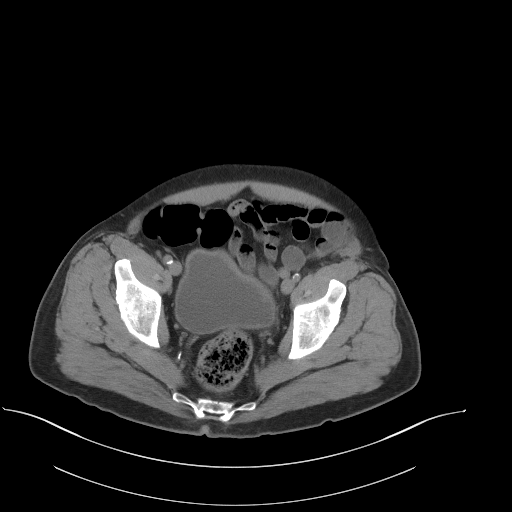
[im 27/86  soft-tissue]
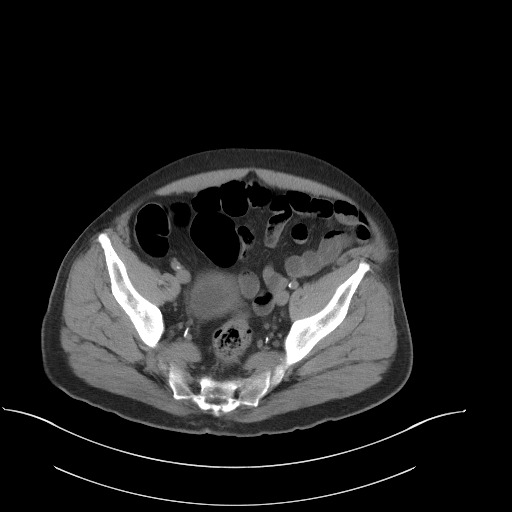
[im 36/86  soft-tissue]
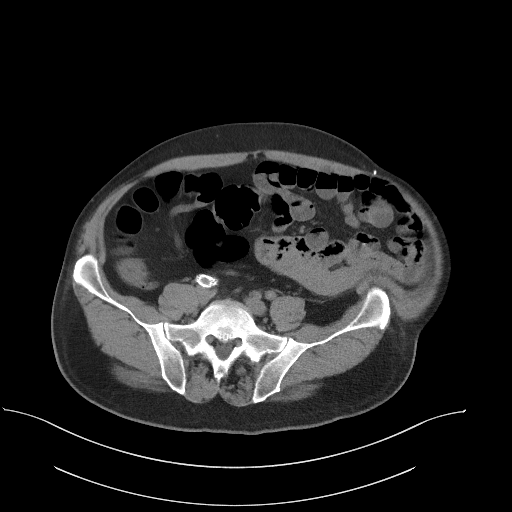
[im 41/86  soft-tissue]
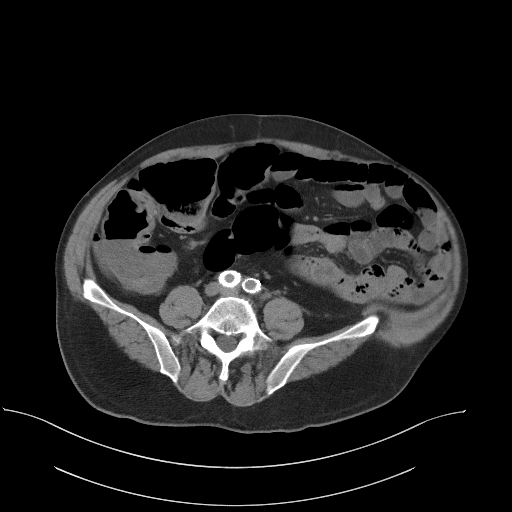
[im 45/86  soft-tissue]
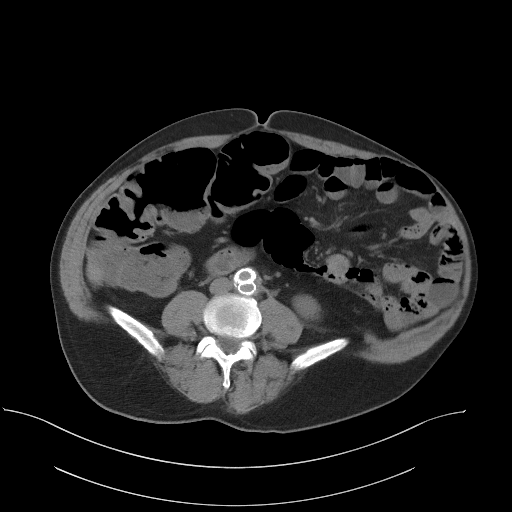
[im 50/86  soft-tissue]
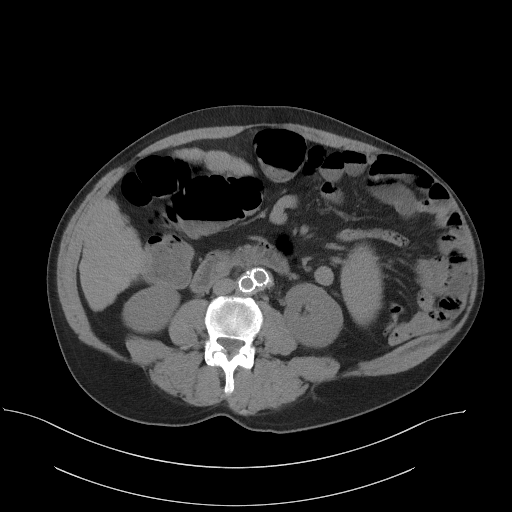
[im 50/86  bone]
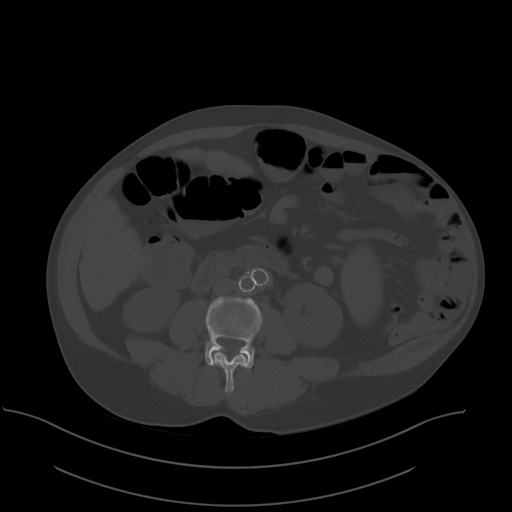
[im 59/86  soft-tissue]
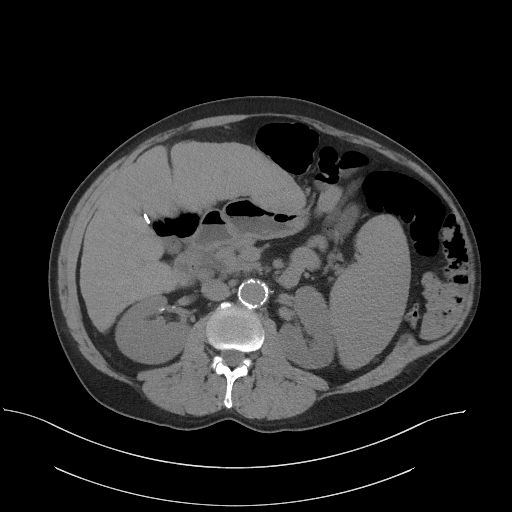
[im 63/86  soft-tissue]
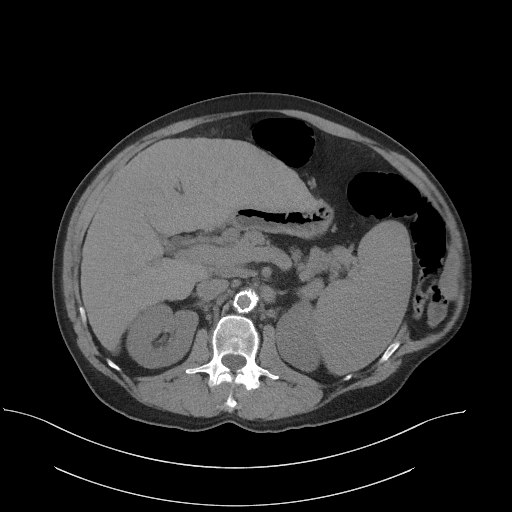
[im 68/86  soft-tissue]
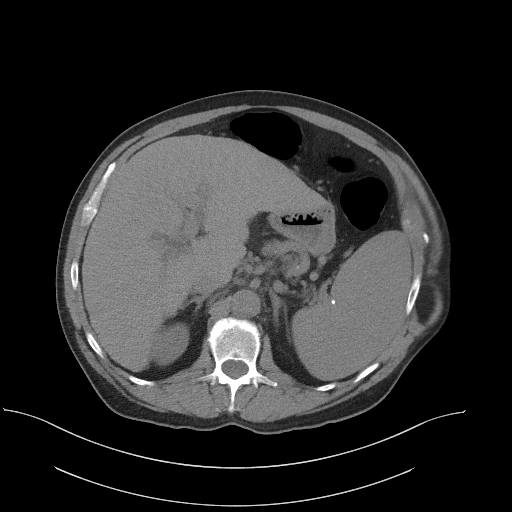
[im 77/86  soft-tissue]
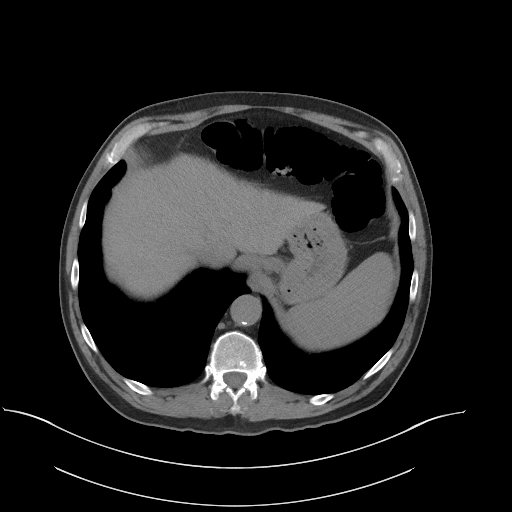
[im 81/86  soft-tissue]
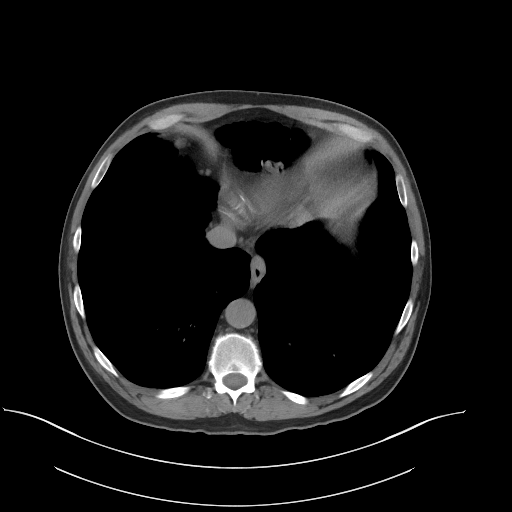

[Series 4: coronal · coronal · 0.80mm/px · 3 of 156 slices shown]
[im 52/156  soft-tissue]
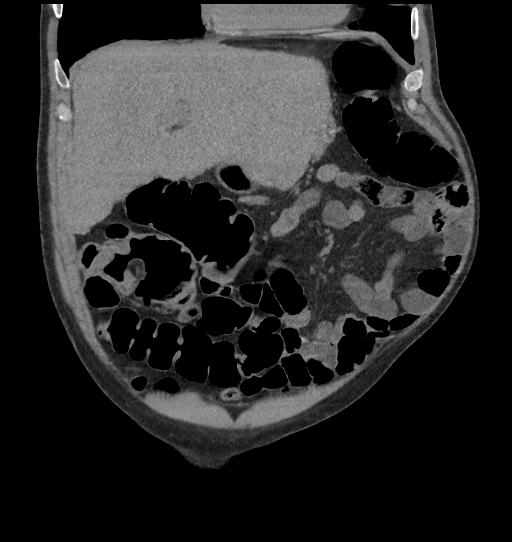
[im 69/156  soft-tissue]
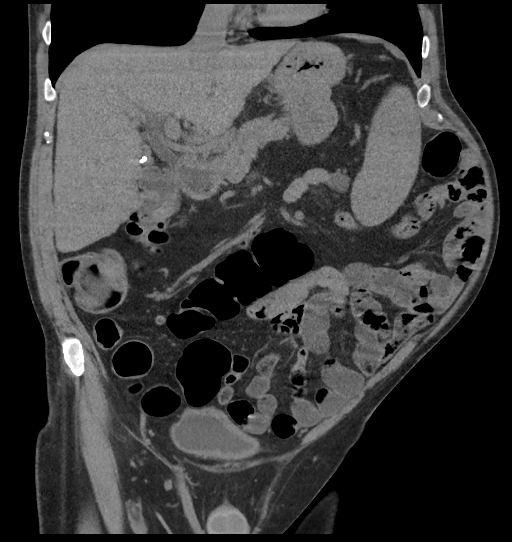
[im 87/156  soft-tissue]
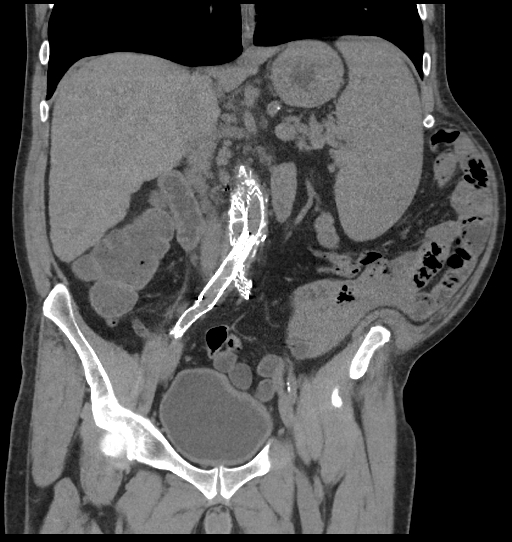

[17 of 46 positions shown; findings below may reference images not displayed]

FINDINGS: Lower chest: Lung bases demonstrate atelectasis at the lingula. No
acute consolidation or effusion. Heart size within normal limits.

Hepatobiliary: Nodular liver contour, suspicious for cirrhosis. No
focal hepatic abnormality. Status post cholecystectomy. No biliary
dilatation

Pancreas: Unremarkable. No pancreatic ductal dilatation or
surrounding inflammatory changes.

Spleen: Enlarged, measuring 15.6 cm.

Adrenals/Urinary Tract: Adrenal glands are within normal limits. No
hydronephrosis. 4 mm stone mid right kidney. Subcentimeter cortical
hypodense lesion mid left kidney too small to further characterize.
The bladder is slightly thick walled.

Stomach/Bowel: Stomach is within normal limits. Appendix appears
normal. No evidence of bowel wall thickening, distention, or
inflammatory changes.

Vascular/Lymphatic: Aortoiliac stent. Atherosclerotic calcification.
Several prominent lymph nodes in the gastrohepatic region, measuring
up to 9 mm.

Reproductive: Slightly enlarged with coarse calcification

Other: Negative for free air or free fluid.

Musculoskeletal: Degenerative changes. No acute or suspicious
abnormality
IMPRESSION: 1. Negative for hydronephrosis or ureteral stone. Slightly
thick-walled urinary bladder, cystitis versus chronic obstruction
2. Nodular liver contour suspect for cirrhosis.  Splenomegaly.
3. Borderline to slightly enlarged upper abdominal lymph nodes
